# Patient Record
Sex: Female | Born: 1990 | Race: Asian | Hispanic: No | Marital: Married | Smoking: Never smoker
Health system: Southern US, Community
[De-identification: ages and names within clinical notes are randomized; demographics above are authoritative.]

## PROBLEM LIST (undated history)

## (undated) ENCOUNTER — Inpatient Hospital Stay (HOSPITAL_COMMUNITY): Payer: Self-pay

## (undated) DIAGNOSIS — B1910 Unspecified viral hepatitis B without hepatic coma: Secondary | ICD-10-CM

## (undated) DIAGNOSIS — R519 Headache, unspecified: Secondary | ICD-10-CM

## (undated) DIAGNOSIS — R51 Headache: Secondary | ICD-10-CM

## (undated) DIAGNOSIS — O98419 Viral hepatitis complicating pregnancy, unspecified trimester: Secondary | ICD-10-CM

## (undated) HISTORY — PX: NO PAST SURGERIES: SHX2092

---
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
## 2011-09-20 ENCOUNTER — Ambulatory Visit (INDEPENDENT_AMBULATORY_CARE_PROVIDER_SITE_OTHER): Payer: Self-pay | Admitting: Family Medicine

## 2011-09-20 VITALS — BP 106/67 | HR 75 | Temp 98.6°F | Resp 16 | Ht <= 58 in | Wt 99.4 lb

## 2011-09-20 DIAGNOSIS — L709 Acne, unspecified: Secondary | ICD-10-CM

## 2011-09-20 DIAGNOSIS — L708 Other acne: Secondary | ICD-10-CM

## 2011-09-20 MED ORDER — CLINDAMYCIN PHOSPHATE 1 % EX GEL: Freq: Two times a day (BID) | CUTANEOUS | Status: AC

## 2011-09-20 NOTE — Progress Notes (Signed)
  Subjective:    Patient ID: Michele Larson, female    DOB: May 10, 1991, 21 y.o.   MRN: [id number]  HPI 21 yo female with facial acne. Broke out 1 week ago.  No medicines.  Hasn't had problem before.  Washes face with Clean and clear.  No lotion or moisturizer.     Review of Systems Negative except as per HPI     Objective:   Physical Exam  Constitutional: She appears well-developed and well-nourished.  Pulmonary/Chest: Effort normal.  Face with scattered, red, pustular acne Around right eye, red, dry, flaky skin on eyelid and along lower lid        Assessment & Plan:  Acne - wash with cetaphil or aveeno.  Then clindamycin gel.  Then cetaphil or aveeno lotion.  BiD  Aquafor or eucerin ointment for dry areas around right eye.   Discussed won't get better immediately.  Can take weeks.

## 2013-06-13 ENCOUNTER — Ambulatory Visit: Payer: Self-pay | Admitting: Family Medicine

## 2013-06-13 VITALS — BP 102/70 | HR 90 | Temp 97.5°F | Resp 16 | Ht <= 58 in | Wt 90.0 lb

## 2013-06-13 DIAGNOSIS — G43109 Migraine with aura, not intractable, without status migrainosus: Secondary | ICD-10-CM

## 2013-06-13 DIAGNOSIS — L709 Acne, unspecified: Secondary | ICD-10-CM

## 2013-06-13 MED ORDER — PROMETHAZINE HCL 12.5 MG PO TABS: 12.5000 mg | ORAL_TABLET | Freq: Three times a day (TID) | ORAL | Status: DC | PRN

## 2013-06-13 MED ORDER — SUMATRIPTAN SUCCINATE 100 MG PO TABS: 100.0000 mg | ORAL_TABLET | ORAL | Status: DC | PRN

## 2013-06-13 MED ORDER — TOPIRAMATE 50 MG PO TABS: 50.0000 mg | ORAL_TABLET | Freq: Two times a day (BID) | ORAL | Status: DC

## 2013-06-13 NOTE — Patient Instructions (Signed)
Recurrent Migraine Headache  A migraine headache is an intense, throbbing pain on one or both sides of your head. Recurrent migraines keep coming back. A migraine can last for 30 minutes to several hours.  CAUSES   The exact cause of a migraine headache is not always known. However, a migraine may be caused when nerves in the brain become irritated and release chemicals that cause inflammation. This causes pain.   SYMPTOMS    Pain on one or both sides of your head.   Pulsating or throbbing pain.   Severe pain that prevents daily activities.   Pain that is aggravated by any physical activity.   Nausea, vomiting, or both.   Dizziness.   Pain with exposure to bright lights, loud noises, or activity.   General sensitivity to bright lights, loud noises, or smells.  Before you get a migraine, you may get warning signs that a migraine is coming (aura). An aura may include:   Seeing flashing lights.   Seeing bright spots, halos, or zig-zag lines.   Having tunnel vision or blurred vision.   Having feelings of numbness or tingling.   Having trouble talking.   Having muscle weakness.  MIGRAINE TRIGGERS  Examples of triggers of migraine headaches include:    Alcohol.   Smoking.   Stress.   Menstruation.   Aged cheeses.   Foods or drinks that contain nitrates, glutamate, aspartame, or tyramine.   Lack of sleep.   Chocolate.   Caffeine.   Hunger.   Physical exertion.   Fatigue.   Medicines used to treat chest pain (nitroglycerine), birth control pills, estrogen, and some blood pressure medicines.  DIAGNOSIS   A recurrent migraine headache is often diagnosed based on:   Symptoms.   Physical examination.   A CT scan or MRI of your head.  TREATMENT   Medicines may be given for pain and nausea. Medicines can also be given to help prevent recurrent migraines.  HOME CARE INSTRUCTIONS   Only take over-the-counter or prescription medicines for pain or discomfort as directed by your caregiver. The use of  long-term narcotics is not recommended.   Lie down in a dark, quiet room when you have a migraine.   Keep a journal to find out what may trigger your migraine headaches. For example, write down:   What you eat and drink.   How much sleep you get.   Any change to your diet or medicines.   Limit alcohol consumption.   Quit smoking if you smoke.   Get 7 to 9 hours of sleep, or as recommended by your caregiver.   Limit stress.   Keep lights dim if bright lights bother you and make your migraines worse.  SEEK MEDICAL CARE IF:    You do not get relief from the medicines given to you.   You have a recurrence of pain.  SEEK IMMEDIATE MEDICAL CARE IF:   Your migraine becomes severe.   You have a fever.   You have a stiff neck.   You have loss of vision.   You have muscular weakness or loss of muscle control.   You start losing your balance or have trouble walking.   You feel faint or pass out.   You have severe symptoms that are different from your first symptoms.  MAKE SURE YOU:    Understand these instructions.   Will watch your condition.   Will get help right away if you are not doing well or get worse.    Document Released: 04/13/2001 Document Revised: 10/11/2011 Document Reviewed: 07/09/2011  Exit[employer] Patient Information 2014 [employer].

## 2013-06-13 NOTE — Progress Notes (Signed)
@UMFCLOGO @  This chart was scribed for Michele Sidle, MD by Quintella Reichert, ED scribe.  This patient was seen in room [facility] Room 1 and the patient's care was started at 10:19 AM.  Patient ID: Michele Larson MRN: [id number], DOB: [birth year], 22 y.o. Date of Encounter: 06/13/2013, 10:19 AM  Primary Physician: No PCP Per Patient  Chief Complaint  Patient presents with   Emesis    off and on for a long time    Headache     HPI: 22 y.o. year old [place] female who works at a [employer] and is planning to return to school in January.  Pt speaks limited English and her cousin is translating for much of the history.  She presents with over a one-year history of episodes of headaches with associated vomiting.  Pt states that she has an aura which she describes as feeling fatigued and generally unwell, and she then develops a posterior headache and vomits shortly afterwards.  Symptoms do not occur every day.  They are not brought on by eating.  She also notes that after she vomits her chest hurts and her eyes become red.    Pt states that she was diagnosed with migraines in [place].  Cousin states that she also has h/o migraines.    No past medical history on file.   Home Meds: Prior to Admission medications   Not on File    Allergies: No Known Allergies  History   Social History   Marital Status: Single    Spouse Name: N/A    Number of Children: N/A   Years of Education: N/A   Occupational History   Not on file.   Social History Main Topics   Smoking status: Never Smoker    Smokeless tobacco: Not on file   Alcohol Use: Not on file   Drug Use: Not on file   Sexual Activity: Not on file   Other Topics Concern   Not on file   Social History Narrative   No narrative on file     Review of Systems: Constitutional: positive for fatigue and general malaise (preceding headaches). negative for chills, fever, night sweats, weight changes.  HEENT: negative for  vision changes, hearing loss, congestion, rhinorrhea, ST, epistaxis, or sinus pressure Cardiovascular: positive for chest pain (after vomiting). Respiratory: negative for hemoptysis, wheezing, shortness of breath, or cough Abdominal: negative for abdominal pain.  positive for nausea and vomiting.  negative for diarrhea or constipation. Dermatological: negative for rash Neurologic: negative for headache, dizziness, or syncope All other systems reviewed and are otherwise negative with the exception to those above and in the HPI.   Physical Exam: Blood pressure 102/70, pulse 90, temperature 97.5 F (36.4 C), temperature source Oral, resp. rate 16, height 4\' 8"  (1.422 m), weight 90 lb (40.824 kg), last menstrual period 05/21/2013, SpO2 100.00%., Body mass index is 20.19 kg/(m^2). General: Well developed, well nourished, in no acute distress. Head: Normocephalic, atraumatic, eyes without discharge, sclera non-icteric, nares are without discharge.  Fundi are normal, EOMI, PERRL. Bilateral auditory canals clear, TM's are without perforation, pearly grey and translucent with reflective cone of light bilaterally. Oral cavity moist, posterior pharynx without exudate, erythema, peritonsillar abscess, or post nasal drip.   Neck: Supple. No thyromegaly. Full ROM. No lymphadenopathy. Lungs: Clear bilaterally to auscultation without wheezes, rales, or rhonchi. Breathing is unlabored. Heart: RRR with S1 S2. No murmurs, rubs, or gallops appreciated. Abdomen: Soft, non-tender, non-distended with normoactive bowel sounds. No hepatomegaly. No rebound/guarding. No  obvious abdominal masses. Msk:  Strength and tone normal for age. Extremities/Skin: Warm and dry. No clubbing or cyanosis. No edema. No rashes or suspicious lesions.  Marked cystic acne on face Neuro: Alert and oriented X 3. Moves all extremities spontaneously. Gait is normal. CNII-XII grossly in tact. Psych:  Responds to questions appropriately with a  normal affect.     ASSESSMENT AND PLAN:  22 y.o. year old female with migraines with migraines with aura. Migraine headache with aura - Plan: SUMAtriptan (IMITREX) 100 MG tablet, topiramate (TOPAMAX) 50 MG tablet, promethazine (PHENERGAN) 12.5 MG tablet  Benzoyl peroxide preparation to face qhs  Signed, Michele Sidle, MD 06/13/2013 10:19 AM

## 2014-08-02 NOTE — L&D Delivery Note (Signed)
  24 y.o. G1P0 female at [redacted]w[redacted]d who presented after SROM.  Labor augmented with pitocin. Second stage was characterized by recurrent deep variable decelerations and intrauterine intraamniotic infection treated with antibiotics. After pushing for about 3 hours, patient was counseled regarding vacuum assisted delivery vs cesarean delivery.  Risks reviewed in detail, all questions answered.  Patient and her husband opted for vacuum assisted operative vaginal delivery.  Risks of vacuum assistance were discussed in detail, including but not limited to, bleeding, infection, damage to maternal tissues, fetal cephalohematoma, inability to effect vaginal delivery of the head or shoulder dystocia that cannot be resolved by established maneuvers and need for emergency cesarean section. Patient gave verbal consent.   Operative Delivery Note At 11:14 PM a viable female was delivered via Vaginal, Vacuum Investment banker, operational).  Presentation: vertex; Position: Right,, Occiput,, Transverse; Station: +3.  Delivery complicated by shoulder dystocia that lasted for 1 minute and 50 seconds; resolved with the following maneuvers: Lysle Dingwall, suprapubic pressure, corkscrew.  Infant handed over to awaiting NICU team.  APGAR: 2, 6, 8; weight 7 lb 2.8 oz (3255 g).   Placenta status: Intact, Spontaneous.   Cord: 3 vessels with the following complications: None.  Arterial cord pH: 7.22  Anesthesia: Epidural  Episiotomy: Median Lacerations: Perineal;3rd degree Suture Repair: 0 and 3.0 vicryl Est. Blood Loss (mL):  300 ml  Mom to postpartum.  Baby to NICU.  Tereso Newcomer, MD 01/19/2015, 11:50 PM

## 2014-08-26 ENCOUNTER — Other Ambulatory Visit (HOSPITAL_COMMUNITY): Payer: Self-pay | Admitting: Urology

## 2014-08-26 DIAGNOSIS — Z3689 Encounter for other specified antenatal screening: Secondary | ICD-10-CM

## 2014-08-26 LAB — OB RESULTS CONSOLE RPR: RPR: NONREACTIVE

## 2014-08-26 LAB — OB RESULTS CONSOLE RUBELLA ANTIBODY, IGM: RUBELLA: IMMUNE

## 2014-08-26 LAB — OB RESULTS CONSOLE ABO/RH: RH Type: POSITIVE

## 2014-08-26 LAB — OB RESULTS CONSOLE ANTIBODY SCREEN: Antibody Screen: NEGATIVE

## 2014-08-26 LAB — OB RESULTS CONSOLE HIV ANTIBODY (ROUTINE TESTING): HIV: NONREACTIVE

## 2014-08-26 LAB — OB RESULTS CONSOLE HEPATITIS B SURFACE ANTIGEN: Hepatitis B Surface Ag: POSITIVE

## 2014-08-30 ENCOUNTER — Encounter (HOSPITAL_COMMUNITY): Payer: Self-pay

## 2014-08-30 ENCOUNTER — Ambulatory Visit (HOSPITAL_COMMUNITY)
Admission: RE | Admit: 2014-08-30 | Discharge: 2014-08-30 | Disposition: A | Discharge status: Home / Self Care | Payer: Medicaid Other | Source: Ambulatory Visit | Attending: Urology | Admitting: Urology

## 2014-08-30 DIAGNOSIS — Z3A19 19 weeks gestation of pregnancy: Secondary | ICD-10-CM | POA: Diagnosis not present

## 2014-08-30 DIAGNOSIS — Z36 Encounter for antenatal screening of mother: Secondary | ICD-10-CM | POA: Diagnosis not present

## 2014-08-30 DIAGNOSIS — Z3689 Encounter for other specified antenatal screening: Secondary | ICD-10-CM | POA: Insufficient documentation

## 2014-09-03 ENCOUNTER — Other Ambulatory Visit (HOSPITAL_COMMUNITY): Payer: Self-pay | Admitting: Nurse Practitioner

## 2014-09-03 DIAGNOSIS — IMO0002 Reserved for concepts with insufficient information to code with codable children: Secondary | ICD-10-CM

## 2014-09-03 DIAGNOSIS — Z0489 Encounter for examination and observation for other specified reasons: Secondary | ICD-10-CM

## 2014-09-03 DIAGNOSIS — Z0374 Encounter for suspected problem with fetal growth ruled out: Secondary | ICD-10-CM

## 2014-09-06 ENCOUNTER — Encounter (HOSPITAL_COMMUNITY): Payer: Self-pay | Admitting: *Deleted

## 2014-09-06 ENCOUNTER — Inpatient Hospital Stay (HOSPITAL_COMMUNITY)
Admission: AD | Admit: 2014-09-06 | Discharge: 2014-09-06 | Disposition: A | Discharge status: Home / Self Care | Payer: Medicaid Other | Source: Ambulatory Visit | Attending: Obstetrics & Gynecology | Admitting: Obstetrics & Gynecology

## 2014-09-06 DIAGNOSIS — Z3A22 22 weeks gestation of pregnancy: Secondary | ICD-10-CM | POA: Insufficient documentation

## 2014-09-06 DIAGNOSIS — O9989 Other specified diseases and conditions complicating pregnancy, childbirth and the puerperium: Secondary | ICD-10-CM | POA: Insufficient documentation

## 2014-09-06 DIAGNOSIS — J029 Acute pharyngitis, unspecified: Secondary | ICD-10-CM

## 2014-09-06 DIAGNOSIS — J069 Acute upper respiratory infection, unspecified: Secondary | ICD-10-CM | POA: Diagnosis not present

## 2014-09-06 LAB — RAPID STREP SCREEN (MED CTR MEBANE ONLY): Streptococcus, Group A Screen (Direct): NEGATIVE

## 2014-09-06 MED ORDER — GUAIFENESIN ER 600 MG PO TB12: 600.0000 mg | ORAL_TABLET | Freq: Two times a day (BID) | ORAL | Status: DC

## 2014-09-06 MED ORDER — PHENOL 1.4 % MT LIQD: 1.0000 | OROMUCOSAL | Status: DC | PRN

## 2014-09-06 NOTE — MAU Provider Note (Signed)
History     CSN: [id number]  Arrival date and time: 09/06/14 1527   None     Chief Complaint  Patient presents with  . Sore Throat  . Cough   HPI This is a 24 y.o. female at 5532w1d who presents with c/o sore throat and sneezing. Denies ear pain. Denies chills but reports being hot, though did not take temperature. [facility] Denies abdominal pain or bleeding.   RN Note:  Expand All Collapse All   Pt states she has a sore throat & a cough, was hot this morning but did not take her temp @ home. Denies abd pain, bleeding or discharge. [facility]          OB History    Gravida Para Term Preterm AB TAB SAB Ectopic Multiple Living   1               Past Medical History  Diagnosis Date  . Medical history non-contributory     Past Surgical History  Procedure Laterality Date  . No past surgeries      No family history on file.  History  Substance Use Topics  . Smoking status: Never Smoker   . Smokeless tobacco: Not on file  . Alcohol Use: No    Allergies: No Known Allergies  Prescriptions prior to admission  Medication Sig Dispense Refill Last Dose  . promethazine (PHENERGAN) 12.5 MG tablet Take 1 tablet (12.5 mg total) by mouth every 8 (eight) hours as needed for nausea or vomiting. 20 tablet 5   . SUMAtriptan (IMITREX) 100 MG tablet Take 1 tablet (100 mg total) by mouth every 2 (two) hours as needed for migraine or headache. May repeat in 2 hours if headache persists or recurs. 5 tablet 6   . topiramate (TOPAMAX) 50 MG tablet Take 1 tablet (50 mg total) by mouth 2 (two) times daily. 30 tablet 5     Review of Systems  Constitutional: Negative for fever, chills and malaise/fatigue.  HENT: Positive for congestion and sore throat.   Eyes: Negative for blurred vision.  Gastrointestinal: Negative for nausea, vomiting and abdominal pain.  Neurological: Negative for dizziness, weakness and headaches.   Physical Exam   Blood pressure  110/62, pulse 92, temperature 98.3 F (36.8 C), temperature source Oral, resp. rate 18.  Physical Exam  Constitutional: She is oriented to person, place, and time. She appears well-developed and well-nourished. No distress.  HENT:  Head: Normocephalic and atraumatic.  Right Ear: External ear normal.  Left Ear: External ear normal.  Mouth/Throat: Oropharynx is clear and moist. No oropharyngeal exudate (posterior pharynx erethematous, but no pus).  Cardiovascular: Normal rate, regular rhythm and normal heart sounds.  Exam reveals no gallop and no friction rub.   No murmur heard. Respiratory: Effort normal and breath sounds normal. No respiratory distress. She has no wheezes. She has no rales. She exhibits no tenderness.  GI: Soft. She exhibits no distension and no mass. There is no tenderness. There is no rebound and no guarding.  Musculoskeletal: Normal range of motion.  Neurological: She is alert and oriented to person, place, and time.  Skin: Skin is warm and dry.  Psychiatric: She has a normal mood and affect.    MAU Course  Procedures  MDM Results for orders placed or performed during the hospital encounter of 09/06/14 (from the past 24 hour(s))  Rapid strep screen     Status: None   Collection Time: 09/06/14  4:08 PM  Result Value Ref Range   Streptococcus, Group A Screen (Direct) NEGATIVE NEGATIVE    Assessment and Plan  A:  SIUP at [redacted]w[redacted]d        Upper Respiratory Infection, viral       Sore throat, viral  P:  Discussed supportive care       Declined interpretor       Mucinex and chloraseptic spray prn       Come back if develops fever        Follow up in office.  Wynelle Bourgeois 09/06/2014, 3:54 PM

## 2014-09-06 NOTE — Discharge Instructions (Signed)
Nhi?m Trng ???ng H H?p Trn, Ng??i L?n (Upper Respiratory Infection, Adult) Nhi?m trng ???ng h h?p trn (URI) ?i khi cn ???c g?i l c?m l?nh thng th??ng. ???ng h h?p trn bao g?m m?i, xoang, h?ng, kh qu?n v ph? qu?n. Ph? qu?n l ???ng h h?p d?n ??n ph?i. H?u h?t m?i ng??i c?i thi?n trong vng 1 tu?n, nh?ng cc tri?u ch?ng c th? ko di ??n 2 tu?n. Ho cn l?i c th? ko di th?m ch lu h?n.  NGUYN NHN Nhi?u vi rt khc nhau c th? ly nhi?m cc m lt ???ng h h?p trn. Cc m ny tr? nn b? kch thch, vim v th??ng tr? nn r?t ?m ??t. S? s?n sinh ch?t nh?n c?ng l ph? bi?n. C?m l?nh d? ly. B?n c th? d? dng ly vi rt cho ng??i khc khi ti?p xc b?ng mi?ng. ?i?u ny bao g?m hn, dng chung ly, ho ho?c h?t h?i. Ch?m vo mi?ng ho?c m?i b?n v sau ? ch?m vo m?t b? m?t, sau ? ng??i khc ch?m vo b? m?t ny c?ng c th? b? ly vi rt. TRI?U CH?NG Cc tri?u ch?ng th??ng pht tri?n 1-3 ngy sau khi b?n ti?p xc v?i vi rt c?m l?nh. Tri?u ch?ng ? m?i ng??i m?t khc. Cc tri?u ch?ng c th? bao g?m:  Ch?y n??c m?i.  H?t h?i.  Ngh?t m?i.  Kch thch xoang.  ?au h?ng.  M?t ti?ng (vim thanh qu?n).  Ho.  M?t m?i.  ?au nh?c c?.  ?n khng ngon mi?ng.  ?au ??u.  S?t nh?. CH?N ?ON B?n c th? ch?n ?on b?nh c?m l?nh c?a chnh mnh d?a vo nh?ng tri?u ch?ng quen thu?c, v h?u h?t m?i ng??i b? c?m l?nh 2-3 l?n m?i n?m. Chuyn gia ch?m [place] s?c kh?e c?a b?n c th? xc nh?n ?i?u ny sau khi khm b?n. Quan tr?ng nh?t, chuyn gia ch?m [place] y t? c?a b?n c th? ki?m tra v ??m b?o r?ng cc tri?u ch?ng c?a b?n khng ph?i do m?t b?nh khc nh? vim h?ng, vim xoang, vim ph?i, hen suy?n ho?c vim ti?u thi?t. Khng c?n thi?t xt nghi?m mu, ki?m tra h?ng v ch?p [place] ?? ch?n ?on c?m l?nh thng th??ng, nh?ng ?i khi chng c th? h?u ch trong vi?c lo?i tr? cc b?nh khc nghim tr?ng h?n. Chuyn gia ch?m [place] s?c kh?e c?a b?n s? quy?t ??nh c c?n thm cc xt nghi?m khc khng. R?I RO V  BI?N CH?NG B?n c th? c nguy c? b? m?t tr??ng h?p nghim tr?ng h?n c?m l?nh thng th??ng n?u b?n ht thu?c l, c b?nh tim m?n tnh (nh? suy tim), ho?c b?nh ph?i (nh? hen suy?n), ho?c n?u b?n c h? th?ng mi?n d?ch suy y?u. Nh?ng ng??i r?t tr? v r?t gi c?ng c nguy c? nhi?m trng nghim tr?ng h?n. Vim xoang do vi khu?n, nhi?m trng tai gi?a, vim ph?i do vi khu?n c th? khi?n c?m l?nh thng th??ng ph?c t?p thm. C?m l?nh thng th??ng c th? lm tr?m tr?ng thm b?nh hen suy?n v t?c ngh?n ph?i m?n tnh (COPD). ?i khi, nh?ng bi?n ch?ng ny c th? yu c?u ch?m [place] y t? kh?n c?p v c th? ?e d?a tnh m?ng. PHNG NG?A Cch t?t nh?t ?? b?o v? trnh c?m l?nh l th?c hnh v? sinh t?t. Hessie Diener ti?p xc b?ng mi?ng ho?c b?ng tay v?i nh?ng ng??i c tri?u ch?ng c?m l?nh. R?a tay th??ng xuyn n?u ti?p xc x?y ra. Khng c b?ng ch?ng r rng r?ng  vitamin C, vitamin E, Echinacea ho?c t?p th? d?c lm gi?m kh? n?ng pht tri?n c?m l?nh. Tuy nhin, b?n lun nn ngh? ng?i nhi?u v c ch? ?? dinh d??ng t?t. ?I?U TR? ?i?u tr? nh?m gi?m tri?u ch?ng. Khng c thu?c ch?a. Khng sinh khng hi?u qu?, v nhi?m trng do vi rt gy ra, ch? khng ph?i do vi khu?n. ?i?u tr? c th? bao g?m:  U?ng nhi?u n??c h?n. ?? u?ng th? thao cung c?p ch?t ?i?n gi?i, ???ng v cc ch?t l?ng c gi tr?Marland Kitchen  Th? s??ng m n??c nng ho?c h?i n??c (bnh phun h?i ho?c vi sen).  ?n sp g ho?c n??c canh trong khc v duy tr ch? ?? dinh d??ng t?t.  Ngh? ng?i th?t nhi?u.  S? d?ng thu?c [place] mi?ng ho?c thu?c u?ng ?? t?o c?m gic tho?i mi.  Ki?m sot s?t b?ng ibuprofen ho?c acetaminophen theo ch? d?n c?a chuyn gia ch?m [place] s?c kh?e.  T?ng c??ng s? d?ng thu?c d?ng ht n?u b?n b? b?nh hen suy?n. S? d?ng gel k?m v k?o dn k?m trong 24 gi? ??u tin sau khi b? c?m l?nh thng th??ng c th? rt ng?n th?i gian v lm gi?m b?t m?c ?? nghim tr?ng c?a cc tri?u ch?ng. Thu?c gi?m ?au c th? c tc d?ng v?i s?t, ?au nh?c c? b?p v ?au c? h?ng. Nhi?u lo?i thu?c  khng c?n k ??n c s?n ?? ?i?u tr? t?c ngh?n v ch?y n??c m?i. Chuyn gia ch?m [place] s?c kh?e c?a b?n c th? ??a ra cc khuy?n co v c th? ?? ngh? s? d?ng thu?c x?t m?i ho?c ph?i cho cc tri?u ch?ng khc. H??NG D?N CH?M [place] T?I NH  Ch? s? d?ng thu?c khng c?n k toa ho?c thu?c c?n k toa ?? gi?m ?au, gi?m c?m gic kh ch?u ho?c h? s?t theo ch? d?n c?a chuyn gia ch?m [place] s?c kh?e c?a b?n.  S? d?ng my t?o ?? ?m s??ng m ?m ho?c ht h?i n??c t? m?t vi sen ?? t?ng ?? ?m khng kh. ?i?u ny c th? ti?t ?m v gip d? th? h?n.  U?ng ?? n??c v dung d?ch ?? n??c ti?u trong ho?c c mu vng nh?t.  Ngh? ng?i theo nhu c?u.  Tr? l?i lm vi?c khi nhi?t ?? c?a b?n ? tr? l?i bnh th??ng ho?c chuyn gia ch?m [place] y t? [place] b?n lm nh? v?y. B?n c th? c?n ? nh lu h?n ?? trnh ly nhi?m cho ng??i khc. B?n c?ng c th? s? d?ng m?t n? v r?a tay c?n th?n ?? ng?n ng?a ly lan vi rt. HY ?I KHM N?U:  Sau vi ngy ??u tin, b?n c?m th?y t? h?n ch? khng ph?i l t?t h?n.  B?n c?n l?i khuyn c?a chuyn gia ch?m [place] s?c kh?e v? thu?c ?? ki?m sot cc tri?u ch?ng.  B?n b? ?n l?nh, kh th? tr?m tr?ng h?n ho?c ??m mu nu ho?c ??. ?y c th? l nh?ng d?u hi?u c?a vim ph?i.  B?n b? ch?y n??c m?i mu vng ho?c nu ho?c ?au ? m?t, ??c bi?t l khi b?n u?n cong v? pha tr??c. ?y c th? l nh?ng d?u hi?u c?a vim xoang.  B?n b? s?t, s?ng h?ch c?, ?au khi nu?t ho?c cc vng mu tr?ng ? m?t sau c?a c? h?ng. ?y c th? l nh?ng d?u hi?u c?a vim h?ng. HY NGAY L?P T?C ?I KHM N?U:  B?n b? s?t.  B?n b? nh?c ??u d? d?i ho?c dai d?ng, ?au tai, ?au  xoang ho?c ?au ng?c.  B?n b? th? kh kh, ho ko di, ho ra mu ho?c c s? thay ??i trong ch?t nh?n thng th??ng c?a b?n (n?u b?n b? b?nh ph?i m?n tnh).  B?n b? ?au c? ho?c c? c?ng. Document Released: 02/01/2011 Document Revised: 03/21/2013 [facility] Patient Information 2015 [place]. This information is not intended to replace advice given to you by your  health care provider. Make sure you discuss any questions you have with your health care provider.  Vim h?ng (Pharyngitis) Vim h?ng l tnh tr?ng t?y ??, ?au, v s?ng (vim) ho?ng c?a qu v?.  NGUYN NHN.  Vim h?ng th??ng do nhi?m trng gy ra. Nh?ng nhi?m trng ny ch? y?u do vi rt gy ra (do vi rt) v l m?t ph?n c?a b?nh c?m l?nh. Tuy nhin, ?i khi vim h?ng do vi khu?n gy ra (do vi khu?n). Vim h?ng c?ng c th? do d? ?ng gy ra. Vim h?ng do vi rt c th? ly lan t? ng??i sang ng??i thng qua ho, h?t h?i, v nh?ng v?t d?ng ho?c ?? dng c nhn (c?c, d?a, tha, bn ch?i ?nh r?ng). Vim h?ng do vi khu?n c th? ly lan t? ng??i sang ng??i thng qua ti?p xc g?n g?i h?n, ch?ng h?n nh? hn nhau.  D?U HI?U V TRI?U CH?NG  Cc tri?u ch?ng c?a vim h?ng bao g?m:   ?au h?ng.  M?t m?i (m?t)  S?t nh?.  ?au ??u.  ?au kh?p ho?c ?au c?.  Pht ban Database administrator.  S?ng h?ch b?ch huy?t.  M?t l?p mng trng gi?ng m?ng bm trong h?ng ho?c ami?an (th??ng nhn th?y ? b?nh vim h?ng do vi khu?n). CH?N ?ON  Chuyn gia ch?m [place] s?c kh?e s? h?i qu v? v? b?nh v cc tri?u ch?ng c?a qu v?. Th??ng ch? c?n b?nh s? c?a qu v?, cng v?i khm th?c th? ?? c th? ch?n ?on vim h?ng. ?i khi c?n lm xt nghi?m nhanh tm lin c?u khu?n. C th? c?n lm cc xt nghi?m khc, ty thu?c vo nguyn nhn b? nghi ng?.  ?I?U TR?  Vim h?ng do vi rt th??ng s? ?? trong 3 -4 ngy m khng c?n dng thu?c. Vim h?ng do vi khu?n c th? ???c ?i?u tr? b?ng cc lo?i thu?c di?t vi trng (khng sinh).  H??NG D?N CH?M [place] T?I NH   U?ng ?? n??c v ch?t l?ng ?? n??c ti?u trong ho?c c mu vng nh?t.  Ch? s? d?ng thu?c khng c?n k ??n ho?c thu?c c?n k ??n theo ch? d?n c?a chuyn gia ch?m [place] s?c kh?e:  N?u qu v? ???c k dng thu?c khng sinh, hy b?o ??m vi?c qu v? dng h?t thu?c ngay c? khi qu v? b?t ??u c?m th?y ?? h?n.  Khngdng aspirin.  Ngh? ng?i nhi?u.  Xc mi?ng b?ng 8 ao-x? n??c mu?i ( tha c ph mu?i pha  vo 1 lt ([place]h) n??c) 1 - 2 ti?ng m?t l?n ?? lm d?u h?ng.  Vim ng?m vim h?ng (n?u qu v? khng c nguy c? b? ngh?t th?) ho?c thu?c x?t c th? ???c s? d?ng ?? lm d?u h?ng. ?I KHM N?U:   Qu v? c c?c s?ng to v c c?m gic ?au ? c? qu v?.  Qu v? b? pht ban.  Qu v? ho ra ??m mu xanh l cy, nu vng, ho?c l?n mu. NGAY L?P T?C ?I KHM N?U:   C? qu v? b? c?ng.  Qu v? b? ch?y n??c di  ho?c khng th? nu?t ch?t l?ng.  Qu v? nn m?a ho?c khng th? gi? thu?c ho?c ch?t l?ng trong c? th?.  Qu v? b? ?au nhi?u m khng kh?i sau khi s? d?ng thu?c theo khuy?n ngh?Sander Nephew v? b? kh th? (khng ph?i l do ng?t m?i). ??M B?O QU V?:   Hi?u r cc h??ng d?n ny.  S? theo di tnh tr?ng c?a mnh.  S? yu c?u tr? gip ngay l?p t?c n?u qu v? c?m th?y khng kh?e ho?c th?y tr?m tr?ng h?n. Document Released: 07/19/2005 Document Revised: 05/09/2013 [facility] Patient Information 2015 [place], [place]. This information is not intended to replace advice given to you by your health care provider. Make sure you discuss any questions you have with your health care provider.

## 2014-09-06 NOTE — MAU Note (Signed)
Pt states she has a sore throat & a cough, was hot this morning but did not take her temp @ home.  Denies abd pain, bleeding or discharge.  [facility]

## 2014-09-08 LAB — CULTURE, GROUP A STREP

## 2014-09-27 ENCOUNTER — Ambulatory Visit (HOSPITAL_COMMUNITY): Payer: Self-pay

## 2014-10-01 ENCOUNTER — Ambulatory Visit (HOSPITAL_COMMUNITY)
Admission: RE | Admit: 2014-10-01 | Discharge: 2014-10-01 | Disposition: A | Discharge status: Home / Self Care | Payer: Medicaid Other | Source: Ambulatory Visit | Attending: Nurse Practitioner | Admitting: Nurse Practitioner

## 2014-10-01 DIAGNOSIS — Z0374 Encounter for suspected problem with fetal growth ruled out: Secondary | ICD-10-CM | POA: Diagnosis not present

## 2014-10-01 DIAGNOSIS — Z0489 Encounter for examination and observation for other specified reasons: Secondary | ICD-10-CM

## 2014-10-01 DIAGNOSIS — IMO0002 Reserved for concepts with insufficient information to code with codable children: Secondary | ICD-10-CM | POA: Insufficient documentation

## 2014-10-01 DIAGNOSIS — Z3A25 25 weeks gestation of pregnancy: Secondary | ICD-10-CM | POA: Insufficient documentation

## 2014-10-01 DIAGNOSIS — Z36 Encounter for antenatal screening of mother: Secondary | ICD-10-CM | POA: Diagnosis present

## 2014-12-16 LAB — OB RESULTS CONSOLE GBS: STREP GROUP B AG: NEGATIVE

## 2014-12-16 LAB — OB RESULTS CONSOLE GC/CHLAMYDIA
Chlamydia: NEGATIVE
GC PROBE AMP, GENITAL: NEGATIVE

## 2015-01-10 ENCOUNTER — Other Ambulatory Visit (HOSPITAL_COMMUNITY): Payer: Self-pay | Admitting: Physician Assistant

## 2015-01-10 DIAGNOSIS — O480 Post-term pregnancy: Secondary | ICD-10-CM

## 2015-01-14 ENCOUNTER — Ambulatory Visit (HOSPITAL_COMMUNITY)
Admission: RE | Admit: 2015-01-14 | Discharge: 2015-01-14 | Disposition: A | Discharge status: Home / Self Care | Payer: Medicaid Other | Source: Ambulatory Visit | Attending: Physician Assistant | Admitting: Physician Assistant

## 2015-01-14 ENCOUNTER — Encounter (HOSPITAL_COMMUNITY): Payer: Self-pay

## 2015-01-14 ENCOUNTER — Ambulatory Visit (HOSPITAL_COMMUNITY)
Admission: RE | Admit: 2015-01-14 | Discharge: 2015-01-14 | Disposition: A | Discharge status: Home / Self Care | Payer: Medicaid Other | Source: Ambulatory Visit | Attending: Nurse Practitioner | Admitting: Nurse Practitioner

## 2015-01-14 DIAGNOSIS — O480 Post-term pregnancy: Secondary | ICD-10-CM

## 2015-01-14 DIAGNOSIS — Z3A40 40 weeks gestation of pregnancy: Secondary | ICD-10-CM | POA: Insufficient documentation

## 2015-01-14 HISTORY — DX: Headache: R51

## 2015-01-14 HISTORY — DX: Headache, unspecified: R51.9

## 2015-01-16 ENCOUNTER — Inpatient Hospital Stay (HOSPITAL_COMMUNITY): Admission: RE | Admit: 2015-01-16 | Payer: Medicaid Other | Source: Ambulatory Visit

## 2015-01-17 ENCOUNTER — Inpatient Hospital Stay (HOSPITAL_COMMUNITY)
Admission: AD | Admit: 2015-01-17 | Discharge: 2015-01-17 | Disposition: A | Discharge status: Home / Self Care | Payer: Medicaid Other | Source: Ambulatory Visit | Attending: Obstetrics & Gynecology | Admitting: Obstetrics & Gynecology

## 2015-01-17 ENCOUNTER — Encounter (HOSPITAL_COMMUNITY): Payer: Self-pay | Admitting: *Deleted

## 2015-01-17 DIAGNOSIS — Z3493 Encounter for supervision of normal pregnancy, unspecified, third trimester: Secondary | ICD-10-CM | POA: Insufficient documentation

## 2015-01-17 DIAGNOSIS — Z3A40 40 weeks gestation of pregnancy: Secondary | ICD-10-CM | POA: Insufficient documentation

## 2015-01-17 NOTE — MAU Note (Signed)
Urine in lab 

## 2015-01-19 ENCOUNTER — Inpatient Hospital Stay (HOSPITAL_COMMUNITY): Payer: Medicaid Other | Admitting: Anesthesiology

## 2015-01-19 ENCOUNTER — Encounter (HOSPITAL_COMMUNITY): Payer: Self-pay | Admitting: *Deleted

## 2015-01-19 ENCOUNTER — Inpatient Hospital Stay (HOSPITAL_COMMUNITY)
Admission: AD | Admit: 2015-01-19 | Discharge: 2015-01-21 | DRG: 988 | Disposition: A | Discharge status: Home / Self Care | Payer: Medicaid Other | Source: Ambulatory Visit | Attending: Obstetrics & Gynecology | Admitting: Obstetrics & Gynecology

## 2015-01-19 DIAGNOSIS — O4290 Premature rupture of membranes, unspecified as to length of time between rupture and onset of labor, unspecified weeks of gestation: Secondary | ICD-10-CM

## 2015-01-19 DIAGNOSIS — IMO0001 Reserved for inherently not codable concepts without codable children: Secondary | ICD-10-CM

## 2015-01-19 DIAGNOSIS — Z3A40 40 weeks gestation of pregnancy: Secondary | ICD-10-CM | POA: Diagnosis present

## 2015-01-19 DIAGNOSIS — Z8759 Personal history of other complications of pregnancy, childbirth and the puerperium: Secondary | ICD-10-CM

## 2015-01-19 HISTORY — DX: Unspecified viral hepatitis B without hepatic coma: O98.419

## 2015-01-19 HISTORY — DX: Unspecified viral hepatitis B without hepatic coma: B19.10

## 2015-01-19 LAB — CBC
HCT: 38.9 % (ref 36.0–46.0)
HEMOGLOBIN: 13 g/dL (ref 12.0–15.0)
MCH: 25 pg — ABNORMAL LOW (ref 26.0–34.0)
MCHC: 33.4 g/dL (ref 30.0–36.0)
MCV: 74.8 fL — ABNORMAL LOW (ref 78.0–100.0)
PLATELETS: 222 10*3/uL (ref 150–400)
RBC: 5.2 MIL/uL — AB (ref 3.87–5.11)
RDW: 14.2 % (ref 11.5–15.5)
WBC: 14.4 10*3/uL — AB (ref 4.0–10.5)

## 2015-01-19 LAB — RPR: RPR Ser Ql: NONREACTIVE

## 2015-01-19 LAB — TYPE AND SCREEN
ABO/RH(D): B POS
Antibody Screen: NEGATIVE

## 2015-01-19 LAB — POCT FERN TEST: POCT Fern Test: POSITIVE

## 2015-01-19 LAB — HIV ANTIBODY (ROUTINE TESTING W REFLEX): HIV Screen 4th Generation wRfx: NONREACTIVE

## 2015-01-19 LAB — ABO/RH: ABO/RH(D): B POS

## 2015-01-19 MED ORDER — GENTAMICIN IN SALINE 1-0.9 MG/ML-% IV SOLN
100.0000 mg | Freq: Three times a day (TID) | INTRAVENOUS | Status: DC
  Filled 2015-01-19 (×2): qty 100

## 2015-01-19 MED ORDER — ACETAMINOPHEN 500 MG PO TABS
1000.0000 mg | ORAL_TABLET | Freq: Four times a day (QID) | ORAL | Status: DC | PRN
  Administered 2015-01-19: 1000 mg via ORAL
  Filled 2015-01-19: qty 2

## 2015-01-19 MED ORDER — CITRIC ACID-SODIUM CITRATE 334-500 MG/5ML PO SOLN
30.0000 mL | ORAL | Status: DC | PRN
  Filled 2015-01-19: qty 15

## 2015-01-19 MED ORDER — ONDANSETRON HCL 4 MG/2ML IJ SOLN: 4.0000 mg | Freq: Four times a day (QID) | INTRAMUSCULAR | Status: DC | PRN

## 2015-01-19 MED ORDER — FENTANYL 2.5 MCG/ML BUPIVACAINE 1/10 % EPIDURAL INFUSION (WH - ANES)
14.0000 mL/h | INTRAMUSCULAR | Status: DC | PRN
  Administered 2015-01-19 (×4): 14 mL/h via EPIDURAL
  Filled 2015-01-19 (×3): qty 125

## 2015-01-19 MED ORDER — FLEET ENEMA 7-19 GM/118ML RE ENEM: 1.0000 | ENEMA | RECTAL | Status: DC | PRN

## 2015-01-19 MED ORDER — OXYCODONE-ACETAMINOPHEN 5-325 MG PO TABS: 1.0000 | ORAL_TABLET | ORAL | Status: DC | PRN

## 2015-01-19 MED ORDER — ACETAMINOPHEN 325 MG PO TABS: 650.0000 mg | ORAL_TABLET | ORAL | Status: DC | PRN

## 2015-01-19 MED ORDER — OXYTOCIN 40 UNITS IN LACTATED RINGERS INFUSION - SIMPLE MED
1.0000 m[IU]/min | INTRAVENOUS | Status: DC
  Administered 2015-01-19: 2 m[IU]/min via INTRAVENOUS
  Filled 2015-01-19: qty 1000

## 2015-01-19 MED ORDER — GENTAMICIN SULFATE 40 MG/ML IJ SOLN
120.0000 mg | Freq: Once | INTRAVENOUS | Status: AC
  Administered 2015-01-19: 120 mg via INTRAVENOUS
  Filled 2015-01-19: qty 3

## 2015-01-19 MED ORDER — TERBUTALINE SULFATE 1 MG/ML IJ SOLN: 0.2500 mg | Freq: Once | INTRAMUSCULAR | Status: AC | PRN

## 2015-01-19 MED ORDER — LACTATED RINGERS IV SOLN
500.0000 mL | INTRAVENOUS | Status: DC | PRN
  Administered 2015-01-19 (×3): 500 mL via INTRAVENOUS

## 2015-01-19 MED ORDER — LACTATED RINGERS IV SOLN
INTRAVENOUS | Status: DC
Start: 2015-01-19 — End: 2015-01-20
  Administered 2015-01-19 (×3): via INTRAVENOUS

## 2015-01-19 MED ORDER — HYDROXYZINE HCL 50 MG PO TABS
50.0000 mg | ORAL_TABLET | Freq: Four times a day (QID) | ORAL | Status: DC | PRN
  Filled 2015-01-19: qty 1

## 2015-01-19 MED ORDER — LIDOCAINE HCL (PF) 1 % IJ SOLN
INTRAMUSCULAR | Status: DC | PRN
  Administered 2015-01-19: 5 mL
  Administered 2015-01-19 (×2): 3 mL

## 2015-01-19 MED ORDER — LIDOCAINE HCL (PF) 1 % IJ SOLN
30.0000 mL | INTRAMUSCULAR | Status: DC | PRN
  Administered 2015-01-19: 30 mL via SUBCUTANEOUS
  Filled 2015-01-19: qty 30

## 2015-01-19 MED ORDER — FENTANYL CITRATE (PF) 100 MCG/2ML IJ SOLN: 100.0000 ug | INTRAMUSCULAR | Status: DC | PRN

## 2015-01-19 MED ORDER — EPHEDRINE 5 MG/ML INJ
10.0000 mg | INTRAVENOUS | Status: DC | PRN
Start: 2015-01-19 — End: 2015-01-20
  Filled 2015-01-19: qty 2

## 2015-01-19 MED ORDER — OXYCODONE-ACETAMINOPHEN 5-325 MG PO TABS: 2.0000 | ORAL_TABLET | ORAL | Status: DC | PRN

## 2015-01-19 MED ORDER — OXYTOCIN BOLUS FROM INFUSION
500.0000 mL | INTRAVENOUS | Status: DC
  Administered 2015-01-19: 500 mL via INTRAVENOUS

## 2015-01-19 MED ORDER — PHENYLEPHRINE 40 MCG/ML (10ML) SYRINGE FOR IV PUSH (FOR BLOOD PRESSURE SUPPORT)
80.0000 ug | PREFILLED_SYRINGE | INTRAVENOUS | Status: DC | PRN
Start: 2015-01-19 — End: 2015-01-20
  Filled 2015-01-19: qty 2
  Filled 2015-01-19: qty 20

## 2015-01-19 MED ORDER — DIPHENHYDRAMINE HCL 50 MG/ML IJ SOLN: 12.5000 mg | INTRAMUSCULAR | Status: DC | PRN

## 2015-01-19 MED ORDER — OXYTOCIN 40 UNITS IN LACTATED RINGERS INFUSION - SIMPLE MED: 62.5000 mL/h | INTRAVENOUS | Status: DC

## 2015-01-19 MED ORDER — SODIUM CHLORIDE 0.9 % IV SOLN
2.0000 g | Freq: Four times a day (QID) | INTRAVENOUS | Status: DC
  Administered 2015-01-19: 2 g via INTRAVENOUS
  Filled 2015-01-19 (×4): qty 2000

## 2015-01-19 NOTE — Progress Notes (Signed)
Patient ID: Michele Larson, female   DOB: [birth year], 24 y.o.   MRN: [id number]  In room with patient to evaluate pushing effort.  Began pushing at 2000.   O: Filed Vitals:   01/19/15 2211  BP:   Pulse:   Temp: 100.6 F (38.1 C)  Resp:    FHR 160-170's, +accels, mod variability; decels after contractions VE:  Complete/+1; small amount of caput seen with pushing  A:   Second Stage Category II Maternal Temp  P: Begin Amp and Gen 1 Gram Tylenol Dr. Macon Large in to evaluate patient Michele Larson, CNM

## 2015-01-19 NOTE — Progress Notes (Signed)
Patient ID: [name], female   DOB: 1991-04-20, 24 y.o.   MRN: [id number] Michele Larson is a 24 y.o. G1P0 at [redacted]w[redacted]d admitted for rupture of membranes, early labor  Subjective: Comfortable w/ epidural  Objective: BP 128/65 mmHg  Pulse 88  Temp(Src) 98.2 F (36.8 C) (Oral)  Resp 16  Ht 4\' 9"  (1.448 m)  Wt 63.277 kg (139 lb 8 oz)  BMI 30.18 kg/m2  SpO2 98%  LMP 04/04/2014    FHT:  FHR: 135 bpm, variability: moderate,  accelerations:  Present,  decelerations:  Absent UC:   q 6-34mins  SVE:   Dilation: 5 Effacement (%): 80 Station: -2 Exam by:: Michele Larson, Michele Larson, CNM  Labs: Lab Results  Component Value Date   WBC 14.4* 01/19/2015   HGB 13.0 01/19/2015   HCT 38.9 01/19/2015   MCV 74.8* 01/19/2015   PLT 222 01/19/2015    Assessment / Plan: spontaneous labor, slow progression w/ +HepB and SROM since 0000, will begin pitocin per protocol  Labor: normal Fetal Wellbeing:  Category I Pain Control:  Epidural Pre-eclampsia: n/a I/D:  n/a Anticipated MOD:  NSVD  Michele Larson CNM, WHNP-BC 01/19/2015, 7:25 AM

## 2015-01-19 NOTE — Progress Notes (Signed)
Patient ID: Michele Larson, female   DOB: 08/26/1990, 24 y.o.   MRN: [id number] Michele Larson is a 24 y.o. G1P0 at [redacted]w[redacted]d.  Subjective: Mild pressure w/ UC's  Objective: BP 120/72 mmHg  Pulse 89  Temp(Src) 98.3 F (36.8 C) (Oral)  Resp 20  Ht 4\' 9"  (1.448 m)  Wt 139 lb 8 oz (63.277 kg)  BMI 30.18 kg/m2  SpO2 98%  LMP 04/04/2014   FHT:  FHR: 125 bpm, variability: mod,  accelerations:  15x15,  decelerations:  none UC:   Q 2-3 minutes, strong  Dilation: 10 Dilation Complete Date: 01/19/15 Dilation Complete Time: 1852 Effacement (%): 100 Cervical Position: Middle Station: 0 Presentation: Vertex Exam by:: Dorathy Kinsman, CNM Attempted pushing. Pt unable to feel UC's or push effectively.   Labs: Results for orders placed or performed during the hospital encounter of 01/19/15 (from the past 24 hour(s))  Fern Test     Status: None   Collection Time: 01/19/15  2:27 AM  Result Value Ref Range   POCT Fern Test Positive = ruptured amniotic membanes   CBC     Status: Abnormal   Collection Time: 01/19/15  3:10 AM  Result Value Ref Range   WBC 14.4 (H) 4.0 - 10.5 K/uL   RBC 5.20 (H) 3.87 - 5.11 MIL/uL   Hemoglobin 13.0 12.0 - 15.0 g/dL   HCT 31.5 94.5 - 85.9 %   MCV 74.8 (L) 78.0 - 100.0 fL   MCH 25.0 (L) 26.0 - 34.0 pg   MCHC 33.4 30.0 - 36.0 g/dL   RDW 29.2 44.6 - 28.6 %   Platelets 222 150 - 400 K/uL  Type and screen     Status: None   Collection Time: 01/19/15  3:10 AM  Result Value Ref Range   ABO/RH(D) B POS    Antibody Screen NEG    Sample Expiration 01/22/2015   RPR     Status: None   Collection Time: 01/19/15  3:10 AM  Result Value Ref Range   RPR Ser Ql Non Reactive Non Reactive  HIV antibody     Status: None   Collection Time: 01/19/15  3:10 AM  Result Value Ref Range   HIV Screen 4th Generation wRfx Non Reactive Non Reactive  ABO/Rh     Status: None   Collection Time: 01/19/15  3:10 AM  Result Value Ref Range   ABO/RH(D) B POS     Assessment / Plan: [redacted]w[redacted]d  week IUP Labor: Second stage Fetal Wellbeing:  Category I Pain Control:  Epidural Anticipated MOD:  SVD Will labor down one hour.   [place], [place] 01/19/2015 9:17 PM

## 2015-01-19 NOTE — Anesthesia Preprocedure Evaluation (Signed)
Anesthesia Evaluation  Patient identified by MRN, date of birth, ID band Patient awake    Reviewed: Allergy & Precautions, H&P , NPO status , Patient's Chart, lab work & pertinent test results  History of Anesthesia Complications Negative for: history of anesthetic complications  Airway Mallampati: II  TM Distance: >3 FB Neck ROM: full    Dental no notable dental hx. (+) Teeth Intact   Pulmonary neg pulmonary ROS,  breath sounds clear to auscultation  Pulmonary exam normal       Cardiovascular negative cardio ROS Normal cardiovascular examRhythm:regular Rate:Normal     Neuro/Psych negative neurological ROS  negative psych ROS   GI/Hepatic negative GI ROS, Neg liver ROS,   Endo/Other  negative endocrine ROS  Renal/GU negative Renal ROS  negative genitourinary   Musculoskeletal   Abdominal   Peds  Hematology negative hematology ROS (+)   Anesthesia Other Findings   Reproductive/Obstetrics (+) Pregnancy                             Anesthesia Physical Anesthesia Plan  ASA: II  Anesthesia Plan: Epidural   Post-op Pain Management:    Induction:   Airway Management Planned:   Additional Equipment:   Intra-op Plan:   Post-operative Plan:   Informed Consent: I have reviewed the patients History and Physical, chart, labs and discussed the procedure including the risks, benefits and alternatives for the proposed anesthesia with the patient or authorized representative who has indicated his/her understanding and acceptance.     Plan Discussed with:   Anesthesia Plan Comments:         Anesthesia Quick Evaluation  

## 2015-01-19 NOTE — Progress Notes (Signed)
Patient ID: Michele Larson, female   DOB: October 02, 1990, 24 y.o.   MRN: [id number] Michele Larson is a 24 y.o. G1P0 at [redacted]w[redacted]d.  Subjective: Comfortable w/ epidural.  Objective: BP 121/74 mmHg  Pulse 87  Temp(Src) 98.7 F (37.1 C) (Oral)  Resp 18  Ht 4\' 9"  (1.448 m)  Wt 139 lb 8 oz (63.277 kg)  BMI 30.18 kg/m2  SpO2 98%  LMP 04/04/2014   FHT:  FHR: 145 bpm, variability: mod,  accelerations:  15x15,  decelerations:  none UC:   Q 2-4 minutes, moderate  Dilation: 7 Effacement (%): 80 Cervical Position: Middle Station: 0 Presentation: Vertex Exam by:: Dorathy Kinsman, CNM  Labs: Results for orders placed or performed during the hospital encounter of 01/19/15 (from the past 24 hour(s))  Fern Test     Status: None   Collection Time: 01/19/15  2:27 AM  Result Value Ref Range   POCT Fern Test Positive = ruptured amniotic membanes   CBC     Status: Abnormal   Collection Time: 01/19/15  3:10 AM  Result Value Ref Range   WBC 14.4 (H) 4.0 - 10.5 K/uL   RBC 5.20 (H) 3.87 - 5.11 MIL/uL   Hemoglobin 13.0 12.0 - 15.0 g/dL   HCT 03.4 74.2 - 59.5 %   MCV 74.8 (L) 78.0 - 100.0 fL   MCH 25.0 (L) 26.0 - 34.0 pg   MCHC 33.4 30.0 - 36.0 g/dL   RDW 63.8 75.6 - 43.3 %   Platelets 222 150 - 400 K/uL  Type and screen     Status: None   Collection Time: 01/19/15  3:10 AM  Result Value Ref Range   ABO/RH(D) B POS    Antibody Screen NEG    Sample Expiration 01/22/2015   RPR     Status: None   Collection Time: 01/19/15  3:10 AM  Result Value Ref Range   RPR Ser Ql Non Reactive Non Reactive  ABO/Rh     Status: None   Collection Time: 01/19/15  3:10 AM  Result Value Ref Range   ABO/RH(D) B POS     Assessment / Plan: [redacted]w[redacted]d week IUP Labor: Active Fetal Wellbeing:  Category I Pain Control:  epidural Anticipated MOD:  SVD Considering IUPC if labor progress stalls   [place], CNM 01/19/2015 2:26 PM

## 2015-01-19 NOTE — Progress Notes (Signed)
ANTIBIOTIC CONSULT NOTE - INITIAL  Pharmacy Consult for Gentamicin Indication: Maternal temperature  No Known Allergies  Patient Measurements: Height: 4\' 9"  (144.8 cm) Weight: 139 lb 8 oz (63.277 kg) IBW/kg (Calculated) : 38.6 Adjusted Body Weight:46 kg  Vital Signs: Temp: 100.6 F (38.1 C) (06/19 2211) Temp Source: Axillary (06/19 2211) BP: 122/66 mmHg (06/19 2200) Pulse Rate: 84 (06/19 2200) Intake/Output from previous day:   Intake/Output from this shift: Total I/O In: -  Out: 425 [Urine:425]  Labs:  Recent Labs  01/19/15 0310  WBC 14.4*  HGB 13.0  PLT 222   CrCl cannot be calculated (Patient has no serum creatinine result on file.). No results for input(s): VANCOTROUGH, VANCOPEAK, VANCORANDOM, GENTTROUGH, GENTPEAK, GENTRANDOM, TOBRATROUGH, TOBRAPEAK, TOBRARND, AMIKACINPEAK, AMIKACINTROU, AMIKACIN in the last 72 hours.   Microbiology: No results found for this or any previous visit (from the past 720 hour(s)).  Medical History: Past Medical History  Diagnosis Date  . Headache   . Hepatitis B affecting pregnancy     Medications:  Ampicillin 2 Gm IV every 6 hours  Assessment: 24 yo G1P0 at [redacted]w[redacted]d in second stage of labor, now with increased temperature.  Goal of Therapy:  Gentamicin peaks 6-8 mcg/ml; troughs <1 mcg/ml  Plan:  Gentamicin 120 mg IV loading dose; then gentamicin 100 mg IV every 8 hours Monitor serum creatinine per protocol Serum gentamicin levels if gentamicin is continued >72 hours or as indicated  Arelia Sneddon 01/19/2015,10:46 PM

## 2015-01-19 NOTE — Progress Notes (Signed)
Patient ID: Michele Larson, female   DOB: [birth year], 24 y.o.   MRN: [id number] Dr. Macon Large in with patient to discuss option of VAVD or csection; pt opts to try a VAVD. Eino Farber Kennith Gain, CNM

## 2015-01-19 NOTE — Progress Notes (Signed)
Patient ID: Michele Larson, female   DOB: 04-11-1991, 24 y.o.   MRN: [id number] Michele Larson is a 24 y.o. G1P0 at [redacted]w[redacted]d.  Subjective: Comfortable w/ epidural.  Objective: BP 118/78 mmHg  Pulse 76  Temp(Src) 98.9 F (37.2 C) (Oral)  Resp 18  Ht 4\' 9"  (1.448 m)  Wt 139 lb 8 oz (63.277 kg)  BMI 30.18 kg/m2  SpO2 98%  LMP 04/04/2014   FHT:  FHR: 145 bpm, variability: mod,  accelerations:  15x15,  decelerations:  Variable decelerations, some prolonged to 2-3 minutes resolved with position changes -> Category I UC:   Q 2-4 minutes, moderate  Pitocin is 18 mu/min Dilation: 7 Effacement (%): 80 Cervical Position: Middle Station: 0 Presentation: Vertex Exam by:: Michele Larson  Labs: Results for orders placed or performed during the hospital encounter of 01/19/15 (from the past 24 hour(s))  Fern Test     Status: None   Collection Time: 01/19/15  2:27 AM  Result Value Ref Range   POCT Fern Test Positive = ruptured amniotic membanes   CBC     Status: Abnormal   Collection Time: 01/19/15  3:10 AM  Result Value Ref Range   WBC 14.4 (H) 4.0 - 10.5 K/uL   RBC 5.20 (H) 3.87 - 5.11 MIL/uL   Hemoglobin 13.0 12.0 - 15.0 g/dL   HCT 19.4 17.4 - 08.1 %   MCV 74.8 (L) 78.0 - 100.0 fL   MCH 25.0 (L) 26.0 - 34.0 pg   MCHC 33.4 30.0 - 36.0 g/dL   RDW 44.8 18.5 - 63.1 %   Platelets 222 150 - 400 K/uL  Type and screen     Status: None   Collection Time: 01/19/15  3:10 AM  Result Value Ref Range   ABO/RH(D) B POS    Antibody Screen NEG    Sample Expiration 01/22/2015   RPR     Status: None   Collection Time: 01/19/15  3:10 AM  Result Value Ref Range   RPR Ser Ql Non Reactive Non Reactive  HIV antibody     Status: None   Collection Time: 01/19/15  3:10 AM  Result Value Ref Range   HIV Screen 4th Generation wRfx Non Reactive Non Reactive  ABO/Rh     Status: None   Collection Time: 01/19/15  3:10 AM  Result Value Ref Range   ABO/RH(D) B POS     Assessment / Plan: [redacted]w[redacted]d week IUP Labor:  Prolonged active phase for now, may consider IUPC if no change with next exam. Continue pitocin per protocol. Fetal Wellbeing:  Category II resolved to Category I with position changes, will continue to observe Pain Control:  Epidural Anticipated MOD:  SVD    Michele Newcomer, MD 01/19/2015 4:56 PM

## 2015-01-19 NOTE — MAU Note (Signed)
Having contractions every two to three min- going on since yesterday but worsened at 7 pm.  ?Water broke at midnight clear fluid went thru underwear and ran down her legs.  Baby not moving,  movement was Saturday morning.  No bleeding.

## 2015-01-19 NOTE — H&P (Signed)
Brytani Shorten is a 24 y.o. G1P0 female at [redacted]w[redacted]d by 19wk u/s, presenting w/ report of uc's and srom clear fluid at 2400, decreased fm.   Reports decreased  fetal movement, contractions: regular, vaginal bleeding: none, membranes: ruptured, clear fluid. Initiated prenatal care at [facility] at 20.4 wks.   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  Past Medical History: Past Medical History  Diagnosis Date  . Headache     Past Surgical History: Past Surgical History  Procedure Laterality Date  . No past surgeries      Obstetrical History: OB History    Gravida Para Term Preterm AB TAB SAB Ectopic Multiple Living   1               Social History: History   Social History  . Marital Status: Single    Spouse Name: N/A  . Number of Children: N/A  . Years of Education: N/A   Social History Main Topics  . Smoking status: Never Smoker   . Smokeless tobacco: Not on file  . Alcohol Use: No  . Drug Use: No  . Sexual Activity: Not on file   Other Topics Concern  . None   Social History Narrative    Family History: History reviewed. No pertinent family history.  Allergies: No Known Allergies  Prescriptions prior to admission  Medication Sig Dispense Refill Last Dose  . phenol (CHLORASEPTIC) 1.4 % LIQD Use as directed 1 spray in the mouth or throat as needed for throat irritation / pain. 1 Bottle 0 Past Week at Unknown time  . Prenatal Vit-Fe Fumarate-FA (PRENATAL MULTIVITAMIN) TABS tablet Take 1 tablet by mouth daily at 12 noon.   01/19/2015 at Unknown time  . guaiFENesin (MUCINEX) 600 MG 12 hr tablet Take 1 tablet (600 mg total) by mouth 2 (two) times daily. (Patient not taking: Reported on 01/14/2015) 30 tablet 0 Not Taking     Review of Systems  Pertinent pos/neg as indicated in HPI    Blood pressure 111/72, pulse 85, temperature 98.7 F (37.1 C), temperature source Oral, resp. rate 16, height 4\' 9"  (1.448 m), weight 63.277 kg (139 lb 8 oz), last menstrual period  04/04/2014, SpO2 99 %. General appearance: alert, cooperative and no distress Lungs: clear to auscultation bilaterally Heart: regular rate and rhythm Abdomen: gravid, soft, non-tender Extremities: trace edema DTR's 2+  Fetal monitoring: FHR: 140 bpm, variability: moderate,  Accelerations: Present,  decelerations:  Absent Uterine activity: q 2-81mins  Dilation: 4 Effacement (%): 90 Station: -2 Exam by:: Orinda Kenner RN, Benji RN Presentation: cephalic   Prenatal labs: ABO, Rh: B/Positive/-- (01/25 0000) Antibody: Negative (01/25 0000) Rubella:   RPR: Nonreactive (01/25 0000)  HBsAg: Positive (01/25 0000)  HIV: Non-reactive (01/25 0000)  GBS: Negative (05/16 0000)   1 hr Glucola: 146, 3hr: 73/154/121/123 Genetic screening:  Quad neg Anatomy US: normal  Results for orders placed or performed during the hospital encounter of 01/19/15 (from the past 24 hour(s))  [employer] Time: 01/19/15  2:27 AM  Result Value Ref Range   POCT Fern Test Positive = ruptured amniotic membanes      Assessment:  [redacted]w[redacted]d SIUP  G1P0  SROM clear fluid at 2400  Early labor   HBsAg +  Cat 1 FHR  GBS Negative (05/16 0000)   Late care @ 20wks  Plan:  Admit to BS  IV pain meds/epidural prn active labor  No FSE d/t + Hep B  Expectant management, pitocin if  needed for augmentation  Anticipate NSVB    [name] CNM, WHNP-BC 01/19/2015, 3:10 AM

## 2015-01-19 NOTE — Anesthesia Procedure Notes (Signed)
Epidural Patient location during procedure: OB  Staffing Anesthesiologist: Phillips Grout Performed by: anesthesiologist   Preanesthetic Checklist Completed: patient identified, site marked, surgical consent, pre-op evaluation, timeout performed, IV checked, risks and benefits discussed and monitors and equipment checked  Epidural Patient position: sitting Prep: DuraPrep Patient monitoring: heart rate, continuous pulse ox and blood pressure Approach: right paramedian Location: L4-L5 Injection technique: LOR saline  Needle:  Needle type: Tuohy  Needle gauge: 17 G Needle length: 9 cm and 9 Needle insertion depth: 5 cm Catheter type: closed end flexible Catheter size: 20 Guage Catheter at skin depth: 8 cm Test dose: negative  Assessment Events: blood not aspirated, injection not painful, no injection resistance, negative IV test and no paresthesia  Additional Notes Patient identified. Risks/Benefits/Options discussed with patient including but not limited to bleeding, infection, nerve damage, paralysis, failed block, incomplete pain control, headache, blood pressure changes, nausea, vomiting, reactions to medication both or allergic, itching and postpartum back pain. Confirmed with bedside nurse the patient's most recent platelet count. Confirmed with patient that they are not currently taking any anticoagulation, have any bleeding history or any family history of bleeding disorders. Patient expressed understanding and wished to proceed. All questions were answered. Sterile technique was used throughout the entire procedure. Please see nursing notes for vital signs. Test dose was given through epidural needle and negative prior to continuing to dose epidural or start infusion. Warning signs of high block given to the patient including shortness of breath, tingling/numbness in hands, complete motor block, or any concerning symptoms with instructions to call for help. Patient was given  instructions on fall risk and not to get out of bed. All questions and concerns addressed with instructions to call with any issues.

## 2015-01-20 ENCOUNTER — Encounter (HOSPITAL_COMMUNITY): Payer: Self-pay | Admitting: *Deleted

## 2015-01-20 MED ORDER — TETANUS-DIPHTH-ACELL PERTUSSIS 5-2.5-18.5 LF-MCG/0.5 IM SUSP
0.5000 mL | Freq: Once | INTRAMUSCULAR | Status: AC
  Administered 2015-01-20: 0.5 mL via INTRAMUSCULAR
  Filled 2015-01-20: qty 0.5

## 2015-01-20 MED ORDER — OXYCODONE-ACETAMINOPHEN 5-325 MG PO TABS
1.0000 | ORAL_TABLET | ORAL | Status: DC | PRN
Start: 2015-01-20 — End: 2015-01-21
  Administered 2015-01-21: 1 via ORAL
  Filled 2015-01-20: qty 1

## 2015-01-20 MED ORDER — WITCH HAZEL-GLYCERIN EX PADS: 1.0000 "application " | MEDICATED_PAD | CUTANEOUS | Status: DC | PRN

## 2015-01-20 MED ORDER — ONDANSETRON HCL 4 MG PO TABS: 4.0000 mg | ORAL_TABLET | ORAL | Status: DC | PRN

## 2015-01-20 MED ORDER — ONDANSETRON HCL 4 MG/2ML IJ SOLN: 4.0000 mg | INTRAMUSCULAR | Status: DC | PRN

## 2015-01-20 MED ORDER — ZOLPIDEM TARTRATE 5 MG PO TABS: 5.0000 mg | ORAL_TABLET | Freq: Every evening | ORAL | Status: DC | PRN

## 2015-01-20 MED ORDER — DIPHENHYDRAMINE HCL 25 MG PO CAPS: 25.0000 mg | ORAL_CAPSULE | Freq: Four times a day (QID) | ORAL | Status: DC | PRN

## 2015-01-20 MED ORDER — SENNOSIDES-DOCUSATE SODIUM 8.6-50 MG PO TABS
2.0000 | ORAL_TABLET | ORAL | Status: DC
  Administered 2015-01-20: 2 via ORAL
  Filled 2015-01-20: qty 2

## 2015-01-20 MED ORDER — PRENATAL MULTIVITAMIN CH
1.0000 | ORAL_TABLET | Freq: Every day | ORAL | Status: DC
  Administered 2015-01-20 – 2015-01-21 (×2): 1 via ORAL
  Filled 2015-01-20 (×2): qty 1

## 2015-01-20 MED ORDER — IBUPROFEN 600 MG PO TABS
600.0000 mg | ORAL_TABLET | Freq: Four times a day (QID) | ORAL | Status: DC
  Administered 2015-01-20 – 2015-01-21 (×7): 600 mg via ORAL
  Filled 2015-01-20 (×7): qty 1

## 2015-01-20 MED ORDER — SIMETHICONE 80 MG PO CHEW: 80.0000 mg | CHEWABLE_TABLET | ORAL | Status: DC | PRN

## 2015-01-20 MED ORDER — DIBUCAINE 1 % RE OINT: 1.0000 "application " | TOPICAL_OINTMENT | RECTAL | Status: DC | PRN

## 2015-01-20 MED ORDER — LANOLIN HYDROUS EX OINT: TOPICAL_OINTMENT | CUTANEOUS | Status: DC | PRN

## 2015-01-20 MED ORDER — OXYCODONE-ACETAMINOPHEN 5-325 MG PO TABS
2.0000 | ORAL_TABLET | ORAL | Status: DC | PRN
  Administered 2015-01-20: 2 via ORAL
  Filled 2015-01-20: qty 2

## 2015-01-20 MED ORDER — ACETAMINOPHEN 325 MG PO TABS: 650.0000 mg | ORAL_TABLET | ORAL | Status: DC | PRN

## 2015-01-20 MED ORDER — BENZOCAINE-MENTHOL 20-0.5 % EX AERO
1.0000 "application " | INHALATION_SPRAY | CUTANEOUS | Status: DC | PRN
  Administered 2015-01-20 (×2): 1 via TOPICAL
  Filled 2015-01-20 (×2): qty 56

## 2015-01-20 NOTE — Progress Notes (Signed)
Patient ID: [name], female   DOB: 1991-04-16, 24 y.o.   MRN: [id number] Post Partum Day #1 Subjective:  Michele Larson is a 24 y.o. G1P1001 [redacted]w[redacted]d s/p VAVD.  No acute events overnight.  Pt denies problems with ambulating, voiding or po intake.  She denies nausea or vomiting.  Pain is well controlled.  She has had flatus. She has not had bowel movement.  Lochia Moderate.  Plan for birth control is OCP vs Nexplanon.  Method of Feeding: bottle  Objective: Blood pressure 103/73, pulse 71, temperature 98.3 F (36.8 C), temperature source Oral, resp. rate 18, height 4\' 9"  (1.448 m), weight 63.277 kg (139 lb 8 oz), last menstrual period 04/04/2014, SpO2 99 %, unknown if currently breastfeeding.  Physical Exam:  General: alert, cooperative and no distress Chest: CTAB Heart: RRR no m/r/g Abdomen: +BS, soft, nontender,  Uterine Fundus: firm DVT Evaluation: No evidence of DVT seen on physical exam. Extremities: no edema   Recent Labs  01/19/15 0310  HGB 13.0  HCT 38.9    Assessment/Plan:  ASSESSMENT: Michele Larson is a 24 y.o. G1P1001 [redacted]w[redacted]d s/p VAVD. Doing well on PPD#1. Remains afebrile. Infant is still in the NICU given respiratory distress after shoulder dystocia with delivery.   Plan for discharge tomorrow and Contraception nexplanon vs OCP   LOS: 1 day   Fabio Asa 01/20/2015, 7:36 AM   I examined pt and agree with documentation above and resident plan of care. Eino Farber Kennith Gain, CNM

## 2015-01-20 NOTE — Anesthesia Postprocedure Evaluation (Signed)
  Anesthesia Post-op Note  Patient: Michele Larson  Procedure(s) Performed: * No procedures listed *  Patient Location: PACU and Women's Unit  Anesthesia Type:Epidural  Level of Consciousness: awake, alert  and oriented  Airway and Oxygen Therapy: Patient Spontanous Breathing  Post-op Pain: none  Post-op Assessment: Post-op Vital signs reviewed and Patient's Cardiovascular Status Stable              Post-op Vital Signs: Reviewed and stable  Last Vitals:  Filed Vitals:   01/20/15 0608  BP: 103/73  Pulse: 71  Temp: 36.8 C  Resp: 18    Complications: No apparent anesthesia complications

## 2015-01-20 NOTE — Lactation Note (Signed)
This note was copied from the chart of Michele Dalia Szczesny. Lactation Consultation Note  Initial visit done.  Baby is 12 hours old and in the NICU following a code apgar at delivery.  [facility] pump is at bedside and mom states she has pumped once.  She did not obtain colostrum with first pumping.  She states she has been instructed on hand expression.  Instructed to pump every 3 hours for 15 minutes followed by hand expression.  Milk coming to volume discussed.  Mom states her WIC has expired but she just talked to them per phone.  Pump referral faxed to [facility] office.  Encouraged to call with concerns prn.  Patient Name: Michele Larson JOINO'M Date: 01/20/2015 Reason for consult: Initial assessment;NICU baby   Maternal Data    Feeding Feeding Type: Bottle Fed - Formula Nipple Type: Slow - flow Length of feed: 20 min  LATCH Score/Interventions                      Lactation Tools Discussed/Used Pump Review: Setup, frequency, and cleaning;Milk Storage Initiated by:: RN Date initiated:: 01/20/15   Consult Status Consult Status: Follow-up Date: 01/21/15 Follow-up type: In-patient    Huston Foley 01/20/2015, 11:22 AM

## 2015-01-21 DIAGNOSIS — Z8759 Personal history of other complications of pregnancy, childbirth and the puerperium: Secondary | ICD-10-CM

## 2015-01-21 MED ORDER — IBUPROFEN 600 MG PO TABS: 600.0000 mg | ORAL_TABLET | Freq: Four times a day (QID) | ORAL | Status: DC

## 2015-01-21 NOTE — Discharge Instructions (Signed)
Breastfeeding °Deciding to breastfeed is one of the best choices you can make for you and your baby. A change in hormones during pregnancy causes your breast tissue to grow and increases the number and size of your milk ducts. These hormones also allow proteins, sugars, and fats from your blood supply to make breast milk in your milk-producing glands. Hormones prevent breast milk from being released before your baby is born as well as prompt milk flow after birth. Once breastfeeding has begun, thoughts of your baby, as well as his or her sucking or crying, can stimulate the release of milk from your milk-producing glands.  °BENEFITS OF BREASTFEEDING °For Your Baby °· Your first milk (colostrum) helps your baby's digestive system function better.   °· There are antibodies in your milk that help your baby fight off infections.   °· Your baby has a lower incidence of asthma, allergies, and sudden infant death syndrome.   °· The nutrients in breast milk are better for your baby than infant formulas and are designed uniquely for your baby's needs.   °· Breast milk improves your baby's brain development.   °· Your baby is less likely to develop other conditions, such as childhood obesity, asthma, or type 2 diabetes mellitus.   °For You  °· Breastfeeding helps to create a very special bond between you and your baby.   °· Breastfeeding is convenient. Breast milk is always available at the correct temperature and costs nothing.   °· Breastfeeding helps to burn calories and helps you lose the weight gained during pregnancy.   °· Breastfeeding makes your uterus contract to its prepregnancy size faster and slows bleeding (lochia) after you give birth.   °· Breastfeeding helps to lower your risk of developing type 2 diabetes mellitus, osteoporosis, and breast or ovarian cancer later in life. °SIGNS THAT YOUR BABY IS HUNGRY °Early Signs of Hunger  °· Increased alertness or activity. °· Stretching. °· Movement of the head from  side to side. °· Movement of the head and opening of the mouth when the corner of the mouth or cheek is stroked (rooting). °· Increased sucking sounds, smacking lips, cooing, sighing, or squeaking. °· Hand-to-mouth movements. °· Increased sucking of fingers or hands. °Late Signs of Hunger °· Fussing. °· Intermittent crying. °Extreme Signs of Hunger °Signs of extreme hunger will require calming and consoling before your baby will be able to breastfeed successfully. Do not wait for the following signs of extreme hunger to occur before you initiate breastfeeding:   °· Restlessness. °· A loud, strong cry. °·  Screaming. °BREASTFEEDING BASICS °Breastfeeding Initiation °· Find a comfortable place to sit or lie down, with your neck and back well supported. °· Place a pillow or rolled up blanket under your baby to bring him or her to the level of your breast (if you are seated). Nursing pillows are specially designed to help support your arms and your baby while you breastfeed. °· Make sure that your baby's abdomen is facing your abdomen.   °· Gently massage your breast. With your fingertips, massage from your chest wall toward your nipple in a circular motion. This encourages milk flow. You may need to continue this action during the feeding if your milk flows slowly. °· Support your breast with 4 fingers underneath and your thumb above your nipple. Make sure your fingers are well away from your nipple and your baby's mouth.   °· Stroke your baby's lips gently with your finger or nipple.   °· When your baby's mouth is open wide enough, quickly bring your baby to your   breast, placing your entire nipple and as much of the colored area around your nipple (areola) as possible into your baby's mouth.   °¨ More areola should be visible above your baby's upper lip than below the lower lip.   °¨ Your baby's tongue should be between his or her lower gum and your breast.   °· Ensure that your baby's mouth is correctly positioned  around your nipple (latched). Your baby's lips should create a seal on your breast and be turned out (everted). °· It is common for your baby to suck about 2-3 minutes in order to start the flow of breast milk. °Latching °Teaching your baby how to latch on to your breast properly is very important. An improper latch can cause nipple pain and decreased milk supply for you and poor weight gain in your baby. Also, if your baby is not latched onto your nipple properly, he or she may swallow some air during feeding. This can make your baby fussy. Burping your baby when you switch breasts during the feeding can help to get rid of the air. However, teaching your baby to latch on properly is still the best way to prevent fussiness from swallowing air while breastfeeding. °Signs that your baby has successfully latched on to your nipple:    °· Silent tugging or silent sucking, without causing you pain.   °· Swallowing heard between every 3-4 sucks.   °·  Muscle movement above and in front of his or her ears while sucking.   °Signs that your baby has not successfully latched on to nipple:  °· Sucking sounds or smacking sounds from your baby while breastfeeding. °· Nipple pain. °If you think your baby has not latched on correctly, slip your finger into the corner of your baby's mouth to break the suction and place it between your baby's gums. Attempt breastfeeding initiation again. °Signs of Successful Breastfeeding °Signs from your baby:   °· A gradual decrease in the number of sucks or complete cessation of sucking.   °· Falling asleep.   °· Relaxation of his or her body.   °· Retention of a small amount of milk in his or her mouth.   °· Letting go of your breast by himself or herself. °Signs from you: °· Breasts that have increased in firmness, weight, and size 1-3 hours after feeding.   °· Breasts that are softer immediately after breastfeeding. °· Increased milk volume, as well as a change in milk consistency and color by  the fifth day of breastfeeding.   °· Nipples that are not sore, cracked, or bleeding. °Signs That Your Baby is Getting Enough Milk °· Wetting at least 3 diapers in a 24-hour period. The urine should be clear and pale yellow by age 5 days. °· At least 3 stools in a 24-hour period by age 5 days. The stool should be soft and yellow. °· At least 3 stools in a 24-hour period by age 7 days. The stool should be seedy and yellow. °· No loss of weight greater than 10% of birth weight during the first 3 days of age. °· Average weight gain of 4-7 ounces (113-198 g) per week after age 4 days. °· Consistent daily weight gain by age 5 days, without weight loss after the age of 2 weeks. °After a feeding, your baby may spit up a small amount. This is common. °BREASTFEEDING FREQUENCY AND DURATION °Frequent feeding will help you make more milk and can prevent sore nipples and breast engorgement. Breastfeed when you feel the need to reduce the fullness of your breasts   or when your baby shows signs of hunger. This is called "breastfeeding on demand." Avoid introducing a pacifier to your baby while you are working to establish breastfeeding (the first 4-6 weeks after your baby is born). After this time you may choose to use a pacifier. Research has shown that pacifier use during the first year of a baby's life decreases the risk of sudden infant death syndrome (SIDS). °Allow your baby to feed on each breast as long as he or she wants. Breastfeed until your baby is finished feeding. When your baby unlatches or falls asleep while feeding from the first breast, offer the second breast. Because newborns are often sleepy in the first few weeks of life, you may need to awaken your baby to get him or her to feed. °Breastfeeding times will vary from baby to baby. However, the following rules can serve as a guide to help you ensure that your baby is properly fed: °· Newborns (babies 4 weeks of age or younger) may breastfeed every 1-3  hours. °· Newborns should not go longer than 3 hours during the day or 5 hours during the night without breastfeeding. °· You should breastfeed your baby a minimum of 8 times in a 24-hour period until you begin to introduce solid foods to your baby at around 6 months of age. °BREAST MILK PUMPING °Pumping and storing breast milk allows you to ensure that your baby is exclusively fed your breast milk, even at times when you are unable to breastfeed. This is especially important if you are going back to work while you are still breastfeeding or when you are not able to be present during feedings. Your lactation consultant can give you guidelines on how long it is safe to store breast milk.  °A breast pump is a machine that allows you to pump milk from your breast into a sterile bottle. The pumped breast milk can then be stored in a refrigerator or freezer. Some breast pumps are operated by hand, while others use electricity. Ask your lactation consultant which type will work best for you. Breast pumps can be purchased, but some hospitals and breastfeeding support groups lease breast pumps on a monthly basis. A lactation consultant can teach you how to hand express breast milk, if you prefer not to use a pump.  °CARING FOR YOUR BREASTS WHILE YOU BREASTFEED °Nipples can become dry, cracked, and sore while breastfeeding. The following recommendations can help keep your breasts moisturized and healthy: °· Avoid using soap on your nipples.   °· Wear a supportive bra. Although not required, special nursing bras and tank tops are designed to allow access to your breasts for breastfeeding without taking off your entire bra or top. Avoid wearing underwire-style bras or extremely tight bras. °· Air dry your nipples for 3-4 minutes after each feeding.   °· Use only cotton bra pads to absorb leaked breast milk. Leaking of breast milk between feedings is normal.   °· Use lanolin on your nipples after breastfeeding. Lanolin helps to  maintain your skin's normal moisture barrier. If you use pure lanolin, you do not need to wash it off before feeding your baby again. Pure lanolin is not toxic to your baby. You may also hand express a few drops of breast milk and gently massage that milk into your nipples and allow the milk to air dry. °In the first few weeks after giving birth, some women experience extremely full breasts (engorgement). Engorgement can make your breasts feel heavy, warm, and tender to the   touch. Engorgement peaks within 3-5 days after you give birth. The following recommendations can help ease engorgement: °· Completely empty your breasts while breastfeeding or pumping. You may want to start by applying warm, moist heat (in the shower or with warm water-soaked hand towels) just before feeding or pumping. This increases circulation and helps the milk flow. If your baby does not completely empty your breasts while breastfeeding, pump any extra milk after he or she is finished. °· Wear a snug bra (nursing or regular) or tank top for 1-2 days to signal your body to slightly decrease milk production. °· Apply ice packs to your breasts, unless this is too uncomfortable for you. °· Make sure that your baby is latched on and positioned properly while breastfeeding. °If engorgement persists after 48 hours of following these recommendations, contact your health care provider or a lactation consultant. °OVERALL HEALTH CARE RECOMMENDATIONS WHILE BREASTFEEDING °· Eat healthy foods. Alternate between meals and snacks, eating 3 of each per day. Because what you eat affects your breast milk, some of the foods may make your baby more irritable than usual. Avoid eating these foods if you are sure that they are negatively affecting your baby. °· Drink milk, fruit juice, and water to satisfy your thirst (about 10 glasses a day).   °· Rest often, relax, and continue to take your prenatal vitamins to prevent fatigue, stress, and anemia. °· Continue  breast self-awareness checks. °· Avoid chewing and smoking tobacco. °· Avoid alcohol and drug use. °Some medicines that may be harmful to your baby can pass through breast milk. It is important to ask your health care provider before taking any medicine, including all over-the-counter and prescription medicine as well as vitamin and herbal supplements. °It is possible to become pregnant while breastfeeding. If birth control is desired, ask your health care provider about options that will be safe for your baby. °SEEK MEDICAL CARE IF:  °· You feel like you want to stop breastfeeding or have become frustrated with breastfeeding. °· You have painful breasts or nipples. °· Your nipples are cracked or bleeding. °· Your breasts are red, tender, or warm. °· You have a swollen area on either breast. °· You have a fever or chills. °· You have nausea or vomiting. °· You have drainage other than breast milk from your nipples. °· Your breasts do not become full before feedings by the fifth day after you give birth. °· You feel sad and depressed. °· Your baby is too sleepy to eat well. °· Your baby is having trouble sleeping.   °· Your baby is wetting less than 3 diapers in a 24-hour period. °· Your baby has less than 3 stools in a 24-hour period. °· Your baby's skin or the white part of his or her eyes becomes yellow.   °· Your baby is not gaining weight by 5 days of age. °SEEK IMMEDIATE MEDICAL CARE IF:  °· Your baby is overly tired (lethargic) and does not want to wake up and feed. °· Your baby develops an unexplained fever. °Document Released: 07/19/2005 Document Revised: 07/24/2013 Document Reviewed: 01/10/2013 °Exit[employer]® Patient Information ©2015 [employer]. This information is not intended to replace advice given to you by your health care provider. Make sure you discuss any questions you have with your health care provider. ° ° °Postpartum Care After Vaginal Delivery °After you deliver your newborn (postpartum  period), the usual stay in the hospital is 24-72 hours. If there were problems with your labor or delivery, or if   you have other medical problems, you might be in the hospital longer.  °While you are in the hospital, you will receive help and instructions on how to care for yourself and your newborn during the postpartum period.  °While you are in the hospital: °· Be sure to tell your nurses if you have pain or discomfort, as well as where you feel the pain and what makes the pain worse. °· If you had an incision made near your vagina (episiotomy) or if you had some tearing during delivery, the nurses may put ice packs on your episiotomy or tear. The ice packs may help to reduce the pain and swelling. °· If you are breastfeeding, you may feel uncomfortable contractions of your uterus for a couple of weeks. This is normal. The contractions help your uterus get back to normal size. °· It is normal to have some bleeding after delivery. °· For the first 1-3 days after delivery, the flow is red and the amount may be similar to a period. °· It is common for the flow to start and stop. °· In the first few days, you may pass some small clots. Let your nurses know if you begin to pass large clots or your flow increases. °· Do not  flush blood clots down the toilet before having the nurse look at them. °· During the next 3-10 days after delivery, your flow should become more watery and pink or brown-tinged in color. °· Ten to fourteen days after delivery, your flow should be a small amount of yellowish-white discharge. °· The amount of your flow will decrease over the first few weeks after delivery. Your flow may stop in 6-8 weeks. Most women have had their flow stop by 12 weeks after delivery. °· You should change your sanitary pads frequently. °· Wash your hands thoroughly with soap and water for at least 20 seconds after changing pads, using the toilet, or before holding or feeding your newborn. °· You should feel like you  need to empty your bladder within the first 6-8 hours after delivery. °· In case you become weak, lightheaded, or faint, call your nurse before you get out of bed for the first time and before you take a shower for the first time. °· Within the first few days after delivery, your breasts may begin to feel tender and full. This is called engorgement. Breast tenderness usually goes away within 48-72 hours after engorgement occurs. You may also notice milk leaking from your breasts. If you are not breastfeeding, do not stimulate your breasts. Breast stimulation can make your breasts produce more milk. °· Spending as much time as possible with your newborn is very important. During this time, you and your newborn can feel close and get to know each other. Having your newborn stay in your room (rooming in) will help to strengthen the bond with your newborn.  It will give you time to get to know your newborn and become comfortable caring for your newborn. °· Your hormones change after delivery. Sometimes the hormone changes can temporarily cause you to feel sad or tearful. These feelings should not last more than a few days. If these feelings last longer than that, you should talk to your caregiver. °· If desired, talk to your caregiver about methods of family planning or contraception. °· Talk to your caregiver about immunizations. Your caregiver may want you to have the following immunizations before leaving the hospital: °· Tetanus, diphtheria, and pertussis (Tdap) or tetanus and diphtheria (Td)   immunization. It is very important that you and your family (including grandparents) or others caring for your newborn are up-to-date with the Tdap or Td immunizations. The Tdap or Td immunization can help protect your newborn from getting ill. °· Rubella immunization. °· Varicella (chickenpox) immunization. °· Influenza immunization. You should receive this annual immunization if you did not receive the immunization during  your pregnancy. °Document Released: 05/16/2007 Document Revised: 04/12/2012 Document Reviewed: 03/15/2012 °ExitCare® Patient Information ©2015 [employer]. This information is not intended to replace advice given to you by your health care provider. Make sure you discuss any questions you have with your health care provider. ° °

## 2015-01-21 NOTE — Discharge Summary (Signed)
Obstetric Discharge Summary Reason for Admission: rupture of membranes Prenatal Procedures: none Intrapartum Procedures: vacuum and episiotomy midline with extension to 3rd degree laceratiom Postpartum Procedures: none Complications-Operative and Postpartum: 3rd degree perineal laceration and shoulder dystocia lasting 1 minute 50 seconds. Infant to NICU after birth.     Operative Delivery Note At 11:14 PM a viable female was delivered via Vaginal, Vacuum Investment banker, operational). Presentation: vertex; Position: Right,, Occiput,, Transverse; Station: +3. Delivery complicated by shoulder dystocia that lasted for 1 minute and 50 seconds; resolved with the following maneuvers: Lysle Dingwall, suprapubic pressure, corkscrew. Infant handed over to awaiting NICU team.  APGAR: 2, 6, 8; weight 7 lb 2.8 oz (3255 g).  Placenta status: Intact, Spontaneous.  Cord: 3 vessels with the following complications: None. Arterial cord pH: 7.22  Anesthesia: Epidural  Episiotomy: Median Lacerations: Perineal;3rd degree Suture Repair: 0 and 3.0 vicryl Est. Blood Loss (mL): 300 ml  Hospital Course:  Active Problems:   Active labor at term   Status post vacuum-assisted vaginal delivery   Third degree laceration of perineum during delivery, postpartum   Michele Larson is a 24 y.o. G1P1001 s/p VAVD.  Patient was admitted on 01/19/15 after SROM; labor augmented with pitocin.  She has postpartum course that was uncomplicated including no problems with ambulating, PO intake, urination, pain, or bleeding. The pt feels ready to go home and  will be discharged with outpatient follow-up.   Today: No acute events overnight.  Pt denies problems with ambulating, voiding or po intake.  She denies nausea or vomiting.  Pain is well controlled.  She has had flatus. She has not had bowel movement.  Lochia Minimal.  Plan for birth control is nexplanon. She will call office for appointment. Method of Feeding: breast.bottle Per patient  report, infant is in the NICU for at least 7 days; currently on antibiotics.   Physical Exam:  Filed Vitals:   01/21/15 0640  BP: 123/79  Pulse: 74  Temp: 98.2 F (36.8 C)  Resp: 16  General: alert, cooperative and no distress  Chest: lungs CTAB CV: RRR no m/r/g Lochia: appropriate Uterine Fundus: firm DVT Evaluation: No cords or calf tenderness. No significant calf/ankle edema.  H/H: Lab Results  Component Value Date/Time   HGB 13.0 01/19/2015 03:10 AM   HCT 38.9 01/19/2015 03:10 AM    Discharge Diagnoses: Term Pregnancy-delivered  Discharge Information: Date: 01/21/2015 Activity: pelvic rest Diet: routine  Medications: Ibuprofen Breast feeding:  Yes Condition: stable Instructions: refer to handout Discharge to: home      Medication List    ASK your doctor about these medications        prenatal multivitamin Tabs tablet  Take 1 tablet by mouth daily at 12 noon.           Follow-up Information    Follow up with [facility] [place].   Why:  in 4-6 weeks for postpartum follow up   Contact information:   [address] [place] [address] [phone]      Fabio Asa ,MD OB Fellow 01/21/2015,7:02 AM

## 2015-01-21 NOTE — Progress Notes (Signed)
Pt  Ambulated out  Teaching complete   Sitz bath given and pt to go [facility] for   Breast pump

## 2015-01-21 NOTE — Clinical Social Work Maternal (Signed)
CLINICAL SOCIAL WORK MATERNAL/CHILD NOTE  Patient Details  Name: Towanda Octave MRN: [id number] Date of Birth: 05/10/1991  Date:  2014/09/08  Clinical Social Worker Initiating Note:  Deveney Bayon E. Maryruth Eve Date/ Time Initiated:  01/21/15/1000     Child's Name:  Suzy Bouchard Frediani-Calamari   Legal Guardian:   (Parents: Jonetta Dennie and Amoron Tubby)   Need for Interpreter:  None (MOB speaks English fluently and denied need for [place] interpreter, however, CSW feels there were times that language became a barrier to effective communication.  CSW ensured that MOB is aware that we can call an interpreter at any time if family de)   Date of Referral:        Reason for Referral:   (No referral-NICU admission)   Referral Source:      Address:   ([address] [place] 45-with maternal grandparents)  Phone number:  [phone]   Household Members:  Significant Other, Parents   Natural Supports (not living in the home):  Extended Family, Friends, Immediate Family   Professional Supports:     Employment:     Type of Work:  (FOB works at a [employer] secretary.  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, but plans to stay home with the baby for a while.)   Education:      Museum/gallery curator Resources:  [employer]   Other Resources:      Cultural/Religious Considerations Which May Impact Care: None stated  Strengths:  Ability to meet basic needs , Compliance with medical plan , Home prepared for child , Pediatrician chosen  (After conversation with CSW, MOB has chosen to take baby to [facility] on [address] for pediatric follow up.)   Risk Factors/Current Problems:  None   Cognitive State:  Alert , Linear Thinking    Mood/Affect:  Interested , Relaxed , Comfortable , Calm    CSW Assessment: CSW met with MOB in her third floor room/317 to introduce myself and complete assessment due to baby's admission to NICU at 40 weeks.  MOB was by herself and welcoming of CSW's  intervention.  She states she feels comfortable communicating in [place] and stated understanding that a [place] interpreter can be called at any time if she desires.  She reports feeling well.  MOB asked CSW how long her baby will need to remain in the hospital and how she is eating.  CSW encouraged her to ask baby's bedside RN for an update the next time she visits her baby, but informed her that from CSW's conversation with RN prior to meeting with MOB, baby's course of antibiotics has not yet been determined and baby is working on Designer, jewellery.   MOB reports having a good support system and states that she and FOB are in a relationship and this is their first baby.  She reports that they were living with FOB's sister, but have both moved in with her parents in [place] for additional support with the baby.  MOB is unsure how long they will stay at her parents' house.  MOB states she and FOB have all needed supplies for baby at home.  CSW provided safe sleep education, which MOB was very attentive to and seemingly interested in.   CSW provided education on signs and symptoms of PPD as well as common emotions related to the first two weeks after delivery.  MOB was easy to engage and commits to talking with CSW and or her doctor if she has concerns about her  emotions at any time.  She reports [facility].   CSW asked MOB if she has chosen a pediatrician for the baby, but she did not understand the question.  CSW explained the role of a pediatrician and MOB again informed CSW that [facility].  CSW explained that she could get a pediatrician list from the NICU or she could elect to have baby follow up with a doctor at [facility], the [facility] the [facility] for [facility].  MOB chose the latter.  CSW feels by the end of the conversation, MOB was understanding of baby's need for outpatient follow up.   She reports no questions, concerns or needs at this time.  CSW explained ongoing support services offered by NICU CSW and provided contact information, encouraging MOB to call any time.  MOB stated appreciation.     CSW Plan/Description:  Patient/Family Education , Psychosocial Support and Ongoing Assessment of Needs    Breunna Nordmann Elizabeth, LCSW 01/21/2015, 1:48 PM 

## 2015-01-22 NOTE — Addendum Note (Signed)
Addendum  created 01/22/15 1229 by Savana Spina M Domanique Luckett, RN   Modules edited: Anesthesia LDA, Lines/Drains/Airways Properties Editor   Lines/Drains/Airways Properties Editor:  Properties of line/drain/airway/wound [REMOVED] Perineural (Nerve Sheath) Catheter 01/19/15 have been modified.    

## 2015-01-23 ENCOUNTER — Ambulatory Visit: Payer: Self-pay

## 2015-01-23 NOTE — Lactation Note (Signed)
This note was copied from the chart of Michele Lavra Puglia. Lactation Consultation Note  Patient Name: Michele Larson Date: 01/23/2015 Reason for consult: Follow-up assessment with this mom of a term baby in NICU, now almost 22 days old. Mom has not been pumping frequently enough, but did bring in about 20 -30 ml's of EBM the baby latched and breastfed today for the first time, and did well. She was fussy with latching until she was syringe fed a couple of ml's of EBm,and then she latched easily, with strong suckles and visible swallows. Mom came in full, and felt softer after feeding. Baby fed in cross cradle, at least 10 minutes on each breast, and the self un-latched, calm and satiated looking.  Mom and baby are rooming in [place]. I advised mom to BF with cures, and to pump about 15 minutes every 3 hours, to potect her supply and have EBM instead of formula to ofer as supplement. Basic breast feeding teahcing done on breastfeeding, milk storage, # of wet and dirty diapers, cluster feeding.  [place] interpreter present during consult.    Maternal Data    Feeding Feeding Type: Breast Fed Length of feed: 20 min  LATCH Score/Interventions Latch: Repeated attempts needed to sustain latch, nipple held in mouth throughout feeding, stimulation needed to elicit sucking reflex. (baby latches after a couple of ml's EBm syringe fed slowly into her mouth. she is used to bottle feeding) Intervention(s): Adjust position;Assist with latch  Audible Swallowing: Spontaneous and intermittent  Type of Nipple: Everted at rest and after stimulation  Comfort (Breast/Nipple): Filling, red/small blisters or bruises, mild/mod discomfort  Problem noted: Filling  Hold (Positioning): Assistance needed to correctly position infant at breast and maintain latch. Intervention(s): Breastfeeding basics reviewed;Support Pillows;Position options;Skin to skin  LATCH Score: 7  Lactation Tools  Discussed/Used Pump Review: Setup, frequency, and cleaning   Consult Status Consult Status: Follow-up Date: 01/24/15 Follow-up type: In-patient    Michele Larson 01/23/2015, 2:54 PM

## 2015-06-09 IMAGING — US US OB COMP +14 WK
2 series · 12 of 28 positions shown · non-contrast
Comparison: none

[Series 1: us ob comp +14 wk mfm · 9 of 69 slices shown (1 of 2)]
[im 4/69]
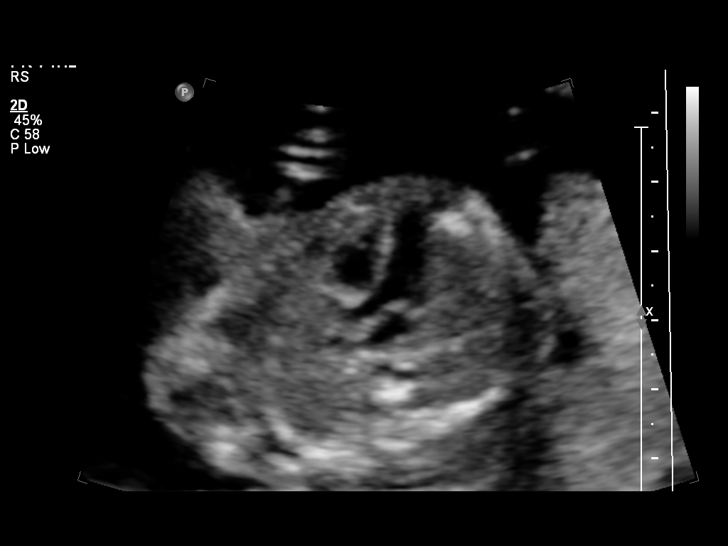
[im 10/69]
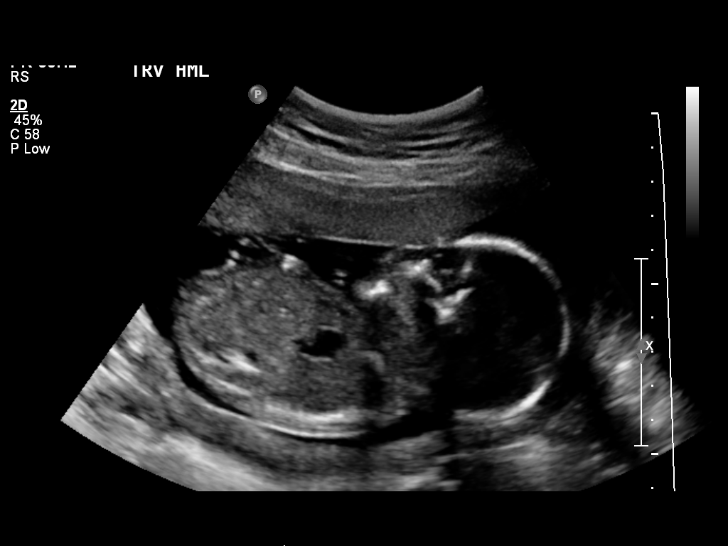
[im 17/69]
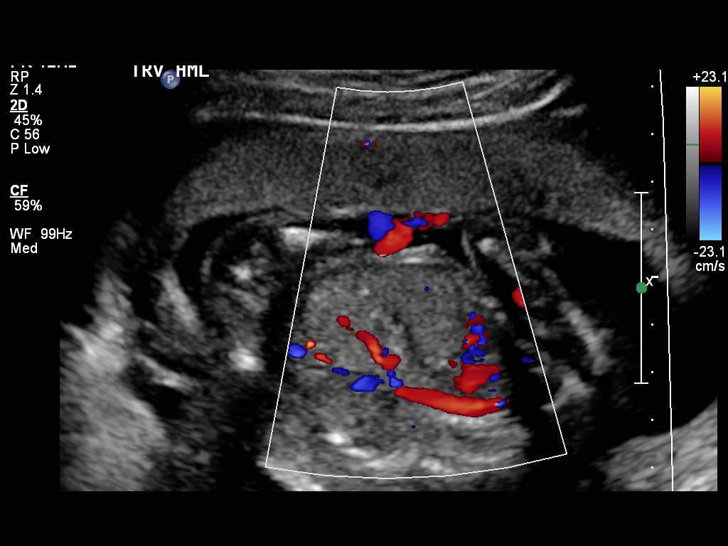
[im 26/69]
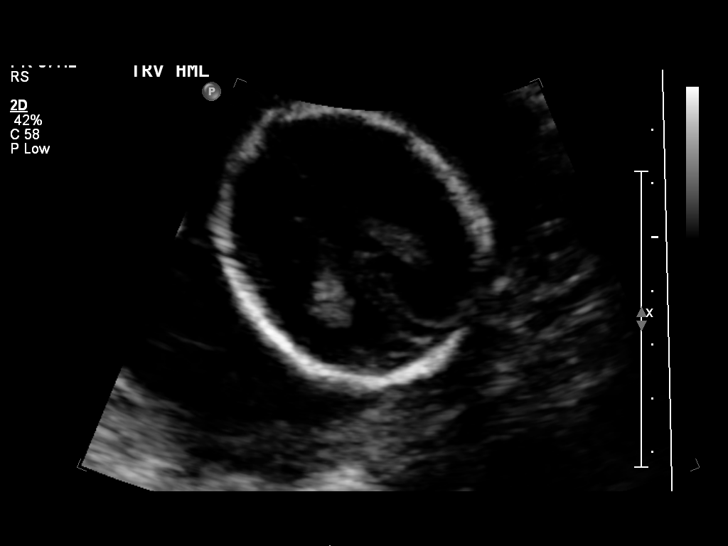
[im 33/69]
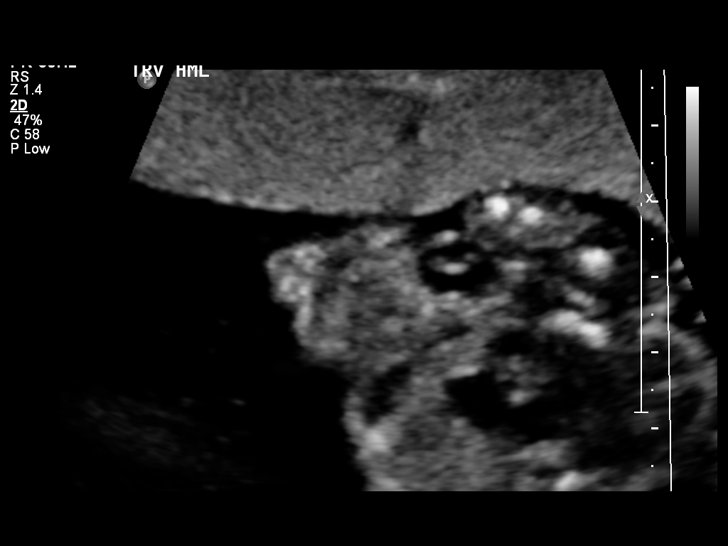
[im 39/69]
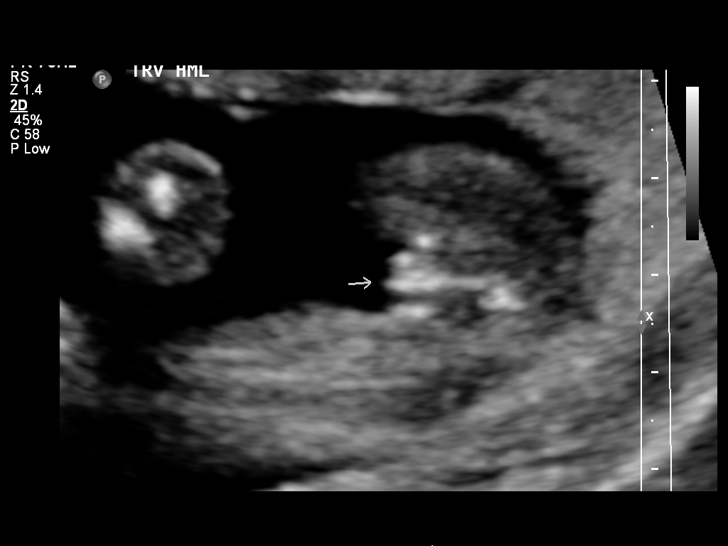
[im 49/69]
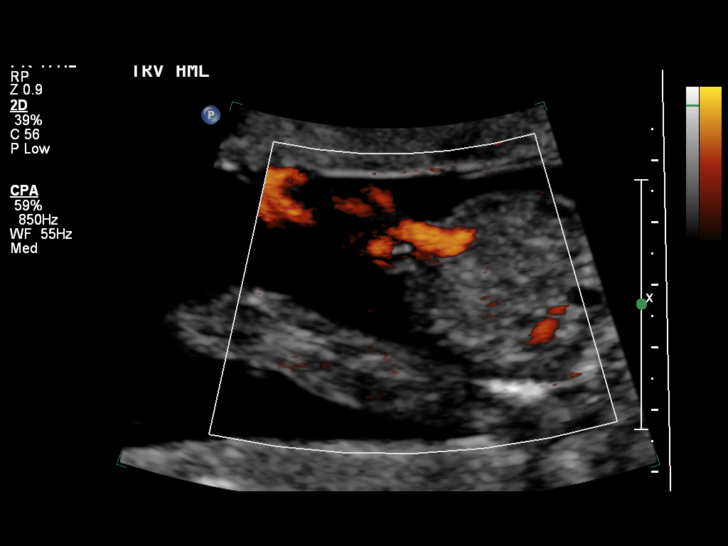
[im 56/69]
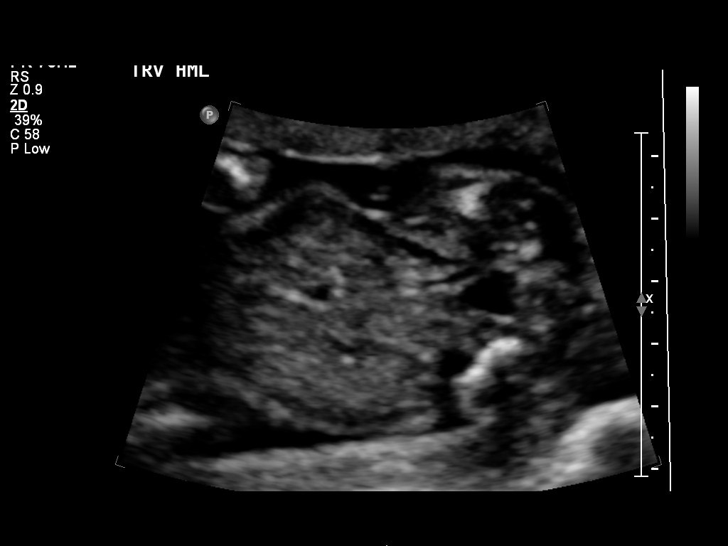
[im 62/69]
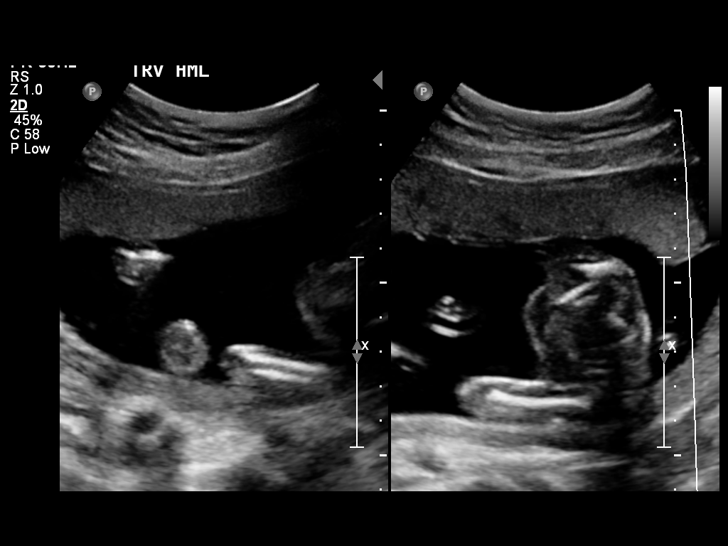

[Series 1: us ob comp +14 wk mfm · 3 of 20 slices shown (2 of 2)]
[im 1/20]
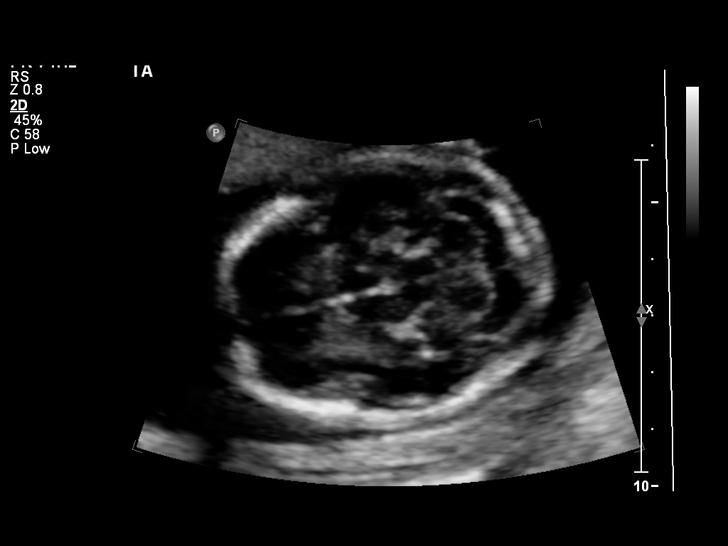
[im 8/20]
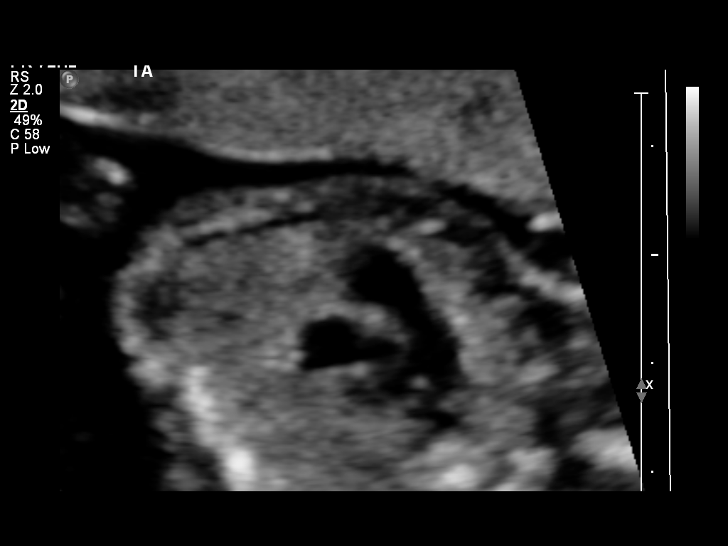
[im 16/20]
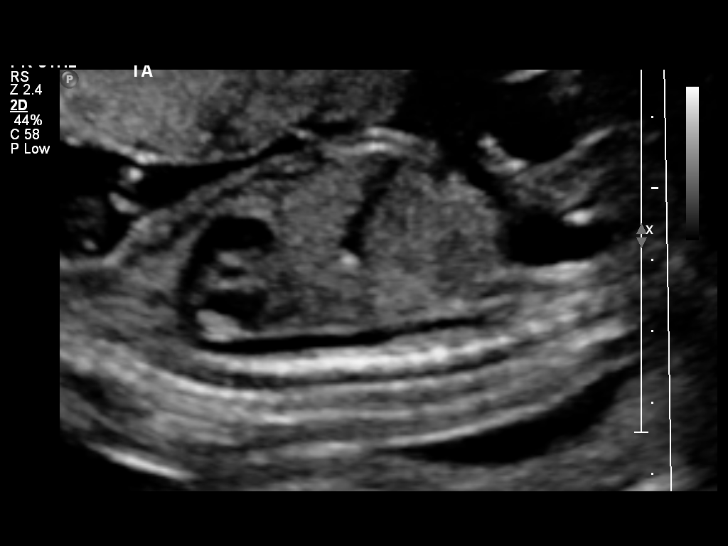

[12 of 28 positions shown; findings below may reference images not displayed]

OBSTETRICS REPORT
                      (Signed Final 08/30/2014 [DATE])

                                                         [REDACTED]-
                                                         Faculty Physician
Service(s) Provided

 US OB COMP + 14 WK                                    76805.1
Indications

 Basic anatomic survey                                 z36
 19 weeks gestation of pregnancy
Fetal Evaluation

 Num Of Fetuses:    1
 Fetal Heart Rate:  155                          bpm
 Cardiac Activity:  Observed
 Presentation:      Transverse, head to
                    maternal left
 Placenta:          Anterior, above cervical os
 P. Cord            Visualized, central
 Insertion:

 Amniotic Fluid
 AFI FV:      Subjectively within normal limits
                                             Larg Pckt:     5.3  cm
Biometry

 BPD:     46.3  mm     G. Age:  20w 0d                CI:        77.57   70 - 86
                                                      FL/HC:      18.0   16.8 -

 HC:     166.4  mm     G. Age:  19w 2d       25  %    HC/AC:      1.13   1.09 -

 AC:     147.9  mm     G. Age:  20w 1d       56  %    FL/BPD:
 FL:        30  mm     G. Age:  19w 2d       27  %    FL/AC:      20.3   20 - 24
 HUM:     30.4  mm     G. Age:  20w 0d       61  %

 Est. FW:     306  gm    0 lb 11 oz      48  %
Gestational Age

 LMP:           21w 1d        Date:  04/04/14                 EDD:   01/09/15
 U/S Today:     19w 5d                                        EDD:   01/19/15
 Best:          19w 5d     Det. By:  U/S (08/30/14)           EDD:   01/19/15
Anatomy
 Cranium:          Appears normal         Aortic Arch:      Appears normal
 Fetal Cavum:      Appears normal         Ductal Arch:      Appears normal
 Ventricles:       Appears normal         Diaphragm:        Appears normal
 Choroid Plexus:   Appears normal         Stomach:          Appears normal, left
                                                            sided
 Cerebellum:       Appears normal         Abdomen:          Appears normal
 Posterior Fossa:  Appears normal         Abdominal Wall:   Appears nml (cord
                                                            insert, abd wall)
 Nuchal Fold:      Appears normal         Cord Vessels:     Appears normal (3
                                                            vessel cord)
 Face:             Appears normal         Kidneys:          Appear normal
                   (orbits and profile)
 Lips:             Appears normal         Bladder:          Appears normal
 Heart:            Appears normal         Spine:            Not well visualized
                   (4CH, axis, and
                   situs)
 RVOT:             Appears normal         Lower             Appears normal
                                          Extremities:
 LVOT:             Appears normal         Upper             Appears normal
                                          Extremities:

 Other:  Fetus appears to be a female. Heels and 5th digit visualized. Nasal
         bone visualized. Technically difficult due to fetal position.
Cervix Uterus Adnexa

 Cervical Length:    2.9      cm

 Cervix:       Normal appearance by transabdominal scan.
Comments

 A 9 day disrepancy is noted between the patient's "sure LMP"
 and ultrasound today.  Recommend adjusting EDD based on
 today's study.
Impression

 Single IUP at 19w 5d
 Limited views of the fetal spine were obtained
 The remainder of the fetal anatomy appears normal
 No markers associated with aneuploidy were appreciated
 Anterior placenta without previa
 Normal amniotic fluid volume
Recommendations

 Recommend follow-up ultrasound examination in 4 weeks for
 growth and to reevaluate the fetal spine.

 questions or concerns.

## 2019-08-01 ENCOUNTER — Ambulatory Visit: Payer: BC Managed Care – PPO | Attending: Internal Medicine

## 2019-08-01 DIAGNOSIS — Z20822 Contact with and (suspected) exposure to covid-19: Secondary | ICD-10-CM

## 2019-08-02 LAB — NOVEL CORONAVIRUS, NAA: SARS-CoV-2, NAA: NOT DETECTED

## 2019-10-25 ENCOUNTER — Inpatient Hospital Stay (HOSPITAL_COMMUNITY)
Admission: AD | Admit: 2019-10-25 | Discharge: 2019-10-26 | Disposition: A | Discharge status: Home / Self Care | Payer: Self-pay | Attending: Obstetrics & Gynecology | Admitting: Obstetrics & Gynecology

## 2019-10-25 ENCOUNTER — Encounter (HOSPITAL_COMMUNITY): Payer: Self-pay | Admitting: *Deleted

## 2019-10-25 ENCOUNTER — Inpatient Hospital Stay (HOSPITAL_COMMUNITY): Payer: Self-pay

## 2019-10-25 DIAGNOSIS — Z3A01 Less than 8 weeks gestation of pregnancy: Secondary | ICD-10-CM

## 2019-10-25 DIAGNOSIS — R109 Unspecified abdominal pain: Secondary | ICD-10-CM

## 2019-10-25 DIAGNOSIS — O26899 Other specified pregnancy related conditions, unspecified trimester: Secondary | ICD-10-CM

## 2019-10-25 DIAGNOSIS — O00202 Left ovarian pregnancy without intrauterine pregnancy: Secondary | ICD-10-CM

## 2019-10-25 DIAGNOSIS — O00102 Left tubal pregnancy without intrauterine pregnancy: Secondary | ICD-10-CM | POA: Insufficient documentation

## 2019-10-25 LAB — COMPREHENSIVE METABOLIC PANEL
ALT: 18 U/L (ref 0–44)
AST: 18 U/L (ref 15–41)
Albumin: 3.9 g/dL (ref 3.5–5.0)
Alkaline Phosphatase: 36 U/L — ABNORMAL LOW (ref 38–126)
Anion gap: 11 (ref 5–15)
BUN: 7 mg/dL (ref 6–20)
CO2: 23 mmol/L (ref 22–32)
Calcium: 9.1 mg/dL (ref 8.9–10.3)
Chloride: 102 mmol/L (ref 98–111)
Creatinine, Ser: 0.56 mg/dL (ref 0.44–1.00)
GFR calc Af Amer: >60 mL/min (ref >60)
GFR calc non Af Amer: >60 mL/min (ref >60)
Glucose, Bld: 86 mg/dL (ref 70–99)
Potassium: 3.6 mmol/L (ref 3.5–5.1)
Sodium: 136 mmol/L (ref 135–145)
Total Bilirubin: 0.5 mg/dL (ref 0.3–1.2)
Total Protein: 7.8 g/dL (ref 6.5–8.1)

## 2019-10-25 LAB — URINALYSIS, ROUTINE W REFLEX MICROSCOPIC
Bilirubin Urine: NEGATIVE
Glucose, UA: NEGATIVE mg/dL
Ketones, ur: 20 mg/dL — AB
Leukocytes,Ua: NEGATIVE
Nitrite: NEGATIVE
Protein, ur: NEGATIVE mg/dL
Specific Gravity, Urine: 1.011 (ref 1.005–1.030)
pH: 5 (ref 5.0–8.0)

## 2019-10-25 LAB — WET PREP, GENITAL
Clue Cells Wet Prep HPF POC: NONE SEEN
Sperm: NONE SEEN
Trich, Wet Prep: NONE SEEN
Yeast Wet Prep HPF POC: NONE SEEN

## 2019-10-25 LAB — POCT PREGNANCY, URINE: Preg Test, Ur: POSITIVE — AB

## 2019-10-25 LAB — CBC
HCT: 39.5 % (ref 36.0–46.0)
Hemoglobin: 12.8 g/dL (ref 12.0–15.0)
MCH: 24.1 pg — ABNORMAL LOW (ref 26.0–34.0)
MCHC: 32.4 g/dL (ref 30.0–36.0)
MCV: 74.4 fL — ABNORMAL LOW (ref 80.0–100.0)
Platelets: 343 10*3/uL (ref 150–400)
RBC: 5.31 MIL/uL — ABNORMAL HIGH (ref 3.87–5.11)
RDW: 12.7 % (ref 11.5–15.5)
WBC: 11.7 10*3/uL — ABNORMAL HIGH (ref 4.0–10.5)
nRBC: 0 % (ref 0.0–0.2)

## 2019-10-25 LAB — HCG, QUANTITATIVE, PREGNANCY: hCG, Beta Chain, Quant, S: 4809 m[IU]/mL — ABNORMAL HIGH (ref <5)

## 2019-10-25 MED ORDER — METHOTREXATE FOR ECTOPIC PREGNANCY
50.0000 mg/m2 | Freq: Once | INTRAMUSCULAR | Status: AC
  Administered 2019-10-25: 75 mg via INTRAMUSCULAR
  Filled 2019-10-25: qty 1

## 2019-10-25 NOTE — ED Provider Notes (Signed)
Patient placed in Quick Look pathway, seen and evaluated   Chief Complaint: Vaginal bleeding  HPI: Last menstrual period September 03, 2019, has had 2+ home pregnancy tests in the past 2 weeks.  This morning she noticed small amount of vaginal bleeding which has continued, no associated pain.  Reports that she is feeling well otherwise.   ROS:  Positive for vaginal bleeding and mild lower abdominal cramping. Denies fever/chills, chest pain/shortness of breath, recent illness, or any additional concerns  Physical Exam:   Gen: No distress  Neuro: Awake and Alert  Skin: Warm    Focused Exam: Patient well-appearing no acute distress, pleasant.  Abdomen soft nontender without peritoneal signs.  Concern for possible miscarriage, as patient is without significant abdominal pain doubt ectopic, appendicitis, diverticulitis, torsion or other emergent pathologies at this time.  She appears stable for transfer to [facility]. - Discussed case with Aurther Loft, NP at [facility] who accepts patient in transfer.   Initiation of care has begun. The patient has been counseled on the process, plan, and necessity for staying for the completion/evaluation, and the remainder of the medical screening examination   Michele Larson 10/25/19 1749    Vanetta Mulders, MD 11/07/19 [phone]

## 2019-10-25 NOTE — Discharge Instructions (Signed)
Ectopic Pregnancy ° °An ectopic pregnancy is when the fertilized egg attaches (implants) outside the uterus. Most ectopic pregnancies occur in one of the tubes where eggs travel from the ovary to the uterus (fallopian tubes), but the implanting can occur in other locations. In rare cases, ectopic pregnancies occur on the ovary, intestine, pelvis, abdomen, or cervix. In an ectopic pregnancy, the fertilized egg does not have the ability to develop into a normal, healthy baby. °A ruptured ectopic pregnancy is one in which tearing or bursting of a fallopian tube causes internal bleeding. Often, there is intense lower abdominal pain, and vaginal bleeding sometimes occurs. Having an ectopic pregnancy can be life-threatening. If this dangerous condition is not treated, it can lead to blood loss, shock, or even death. °What are the causes? °The most common cause of this condition is damage to one of the fallopian tubes. A fallopian tube may be narrowed or blocked, and that keeps the fertilized egg from reaching the uterus. °What increases the risk? °This condition is more likely to develop in women of childbearing age who have different levels of risk. The levels of risk can be divided into three categories. °High risk °· You have gone through infertility treatment. °· You have had an ectopic pregnancy before. °· You have had surgery on the fallopian tubes, or another surgical procedure, such as an abortion. °· You have had surgery to have the fallopian tubes tied (tubal ligation). °· You have problems or diseases of the fallopian tubes. °· You have been exposed to diethylstilbestrol (DES). This medicine was used until 1971, and it had effects on babies whose mothers took the medicine. °· You become pregnant while using an IUD (intrauterine device) for birth control. °Moderate risk °· You have a history of infertility. °· You have had an STI (sexually transmitted infection). °· You have a history of pelvic inflammatory  disease (PID). °· You have scarring from endometriosis. °· You have multiple sexual partners. °· You smoke. °Low risk °· You have had pelvic surgery. °· You use vaginal douches. °· You became sexually active before age 18. °What are the signs or symptoms? °Common symptoms of this condition include normal pregnancy symptoms, such as missing a period, nausea, tiredness, abdominal pain, breast tenderness, and bleeding. However, ectopic pregnancy will have additional symptoms, such as: °· Pain with intercourse. °· Irregular vaginal bleeding or spotting. °· Cramping or pain on one side or in the lower abdomen. °· Fast heartbeat, low blood pressure, and sweating. °· Passing out while having a bowel movement. °Symptoms of a ruptured ectopic pregnancy and internal bleeding may include: °· Sudden, severe pain in the abdomen and pelvis. °· Dizziness, weakness, light-headedness, or fainting. °· Pain in the shoulder or neck area. °How is this diagnosed? °This condition is diagnosed by: °· A pelvic exam to locate pain or a mass in the abdomen. °· A pregnancy test. This blood test checks for the presence as well as the specific level of pregnancy hormone in the bloodstream. °· Ultrasound. This is performed if a pregnancy test is positive. In this test, a probe is inserted into the vagina. The probe will detect a fetus, possibly in a location other than the uterus. °· Taking a sample of uterus tissue (dilation and curettage, or D&C). °· Surgery to perform a visual exam of the inside of the abdomen using a thin, lighted tube that has a tiny camera on the end (laparoscope). °· Culdocentesis. This procedure involves inserting a needle at the top of   the vagina, behind the uterus. If blood is present in this area, it may indicate that a fallopian tube is torn. How is this treated? This condition is treated with medicine or surgery. Medicine  An injection of a medicine (methotrexate) may be given to cause the pregnancy tissue to be  absorbed. This medicine may save your fallopian tube. It may be given if: ? The diagnosis is made early, with no signs of active bleeding. ? The fallopian tube has not ruptured. ? You are considered to be a good candidate for the medicine. Usually, pregnancy hormone blood levels are checked after methotrexate treatment. This is to be sure that the medicine is effective. It may take 4-6 weeks for the pregnancy to be absorbed. Most pregnancies will be absorbed by 3 weeks. Surgery  A laparoscope may be used to remove the pregnancy tissue.  If severe internal bleeding occurs, a larger cut (incision) may be made in the lower abdomen (laparotomy) to remove the fetus and placenta. This is done to stop the bleeding.  Part or all of the fallopian tube may be removed (salpingectomy) along with the fetus and placenta. The fallopian tube may also be repaired during the surgery.  In very rare circumstances, removal of the uterus (hysterectomy) may be required.  After surgery, pregnancy hormone testing may be done to be sure that there is no pregnancy tissue left. Whether your treatment is medicine or surgery, you may receive a Rho (D) immune globulin shot to prevent problems with any future pregnancy. This shot may be given if:  You are Rh-negative and the baby's father is Rh-positive.  You are Rh-negative and you do not know the Rh type of the baby's father. Follow these instructions at home:  Rest and limit your activity after the procedure for as long as told by your health care provider.  Until your health care provider says that it is safe: ? Do not lift anything that is heavier than 10 lb (4.5 kg), or the limit that your health care provider tells you. ? Avoid physical exercise and any movement that requires effort (is strenuous).  To help prevent constipation: ? Eat a healthy diet that includes fruits, vegetables, and whole grains. ? Drink 6-8 glasses of water per day. Get help right away  if:  You develop worsening pain that is not relieved by medicine.  You have: ? A fever or chills. ? Vaginal bleeding. ? Redness and swelling at the incision site. ? Nausea and vomiting.  You feel dizzy or weak.  You feel light-headed or you faint. This information is not intended to replace advice given to you by your health care provider. Make sure you discuss any questions you have with your health care provider. Document Revised: 07/01/2017 Document Reviewed: 02/18/2016 Elsevier Patient Education  [place]. Methotrexate Treatment for an Ectopic Pregnancy, Care After This sheet gives you information about how to care for yourself after your procedure. Your health care provider may also give you more specific instructions. If you have problems or questions, contact your health care provider. What can I expect after the procedure? After the procedure, it is common to have:  Abdominal cramping.  Vaginal bleeding.  Fatigue.  Nausea.  Vomiting.  Diarrhea. Blood tests will be taken at timed intervals for several days or weeks to check your pregnancy hormone levels. The blood tests will be done until the pregnancy hormone can no longer be detected in the blood. Follow these instructions at home: Activity  Do not have sex until your health care provider approves.  Limit activities that take a lot of effort as told by your health care provider. Medicines  Take over the counter and prescription medicines only as told by your health care provider.  Do not take aspirin, ibuprofen, naproxen, or any other NSAIDs.  Do not take folic acid, prenatal vitamins, or other vitamins that contain folic acid. General instructions   Do not drink alcohol.  Follow instructions from your health care provider on how and when to report any symptoms that may indicate a ruptured ectopic pregnancy.  Keep all follow-up visits as told by your health care provider. This is  important. Contact a health care provider if:  You have persistent nausea and vomiting.  You have persistent diarrhea.  You are having a reaction to the medicine, such as: ? Tiredness. ? Skin rash. ? Hair loss. Get help right away if:  Your abdominal or pelvic pain gets worse.  You have more vaginal bleeding.  You feel light-headed or you faint.  You have shortness of breath.  Your heart rate increases.  You develop a cough.  You have chills.  You have a fever. Summary  After the procedure, it is common to have symptoms of abdominal cramping, vaginal bleeding and fatigue. You may also experience other symptoms.  Blood tests will be taken at timed intervals for several days or weeks to check your pregnancy hormone levels. The blood tests will be done until the pregnancy hormone can no longer be detected in the blood.  Limit strenuous activity as told by your health care provider.  Follow instructions from your health care provider on how and when to report any symptoms that may indicate a ruptured ectopic pregnancy. This information is not intended to replace advice given to you by your health care provider. Make sure you discuss any questions you have with your health care provider. Document Revised: 07/01/2017 Document Reviewed: 09/07/2016 Elsevier Patient Education  2020 [employer].

## 2019-10-25 NOTE — MAU Provider Note (Addendum)
History     CSN: [id number]  Arrival date and time: 10/25/19 1653   First Provider Initiated Contact with Patient 10/25/19 1851      Chief Complaint  Patient presents with  . Possible Pregnancy   Ms. Michele Larson is a 29 y.o. G2P1001 at [redacted]w[redacted]d who presents to MAU for vaginal bleeding which began about 3 hours ago. Patient reports she went to use the restroom at work and found she was bleeding. Patient reports she saw blood while wiping and some blood in the toilet and has not bled since that time.  Passing blood clots? no Blood soaking clothes? no Lightheaded/dizzy? no Significant pelvic pain or cramping? Mild cramping that started around the same time as the bleeding and is present at this time Passed any tissue? no  Current pregnancy problems? Pt has not yet been seen Blood Type? B Positive Allergies? NKDA Current medications? none Current PNC & next appt? None, pt requests list of OB providers  Pt denies vaginal discharge/odor/itching. Pt denies N/V, abdominal pain, constipation, diarrhea, or urinary problems. Pt denies fever, chills, fatigue, sweating or changes in appetite. Pt denies SOB or chest pain. Pt denies dizziness, HA, light-headedness, weakness.   OB History    Gravida  2   Para  1   Term  1   Preterm      AB      Living  1     SAB      TAB      Ectopic      Multiple  0   Live Births  1           Past Medical History:  Diagnosis Date  . Headache   . Hepatitis B affecting pregnancy     Past Surgical History:  Procedure Laterality Date  . NO PAST SURGERIES      History reviewed. No pertinent family history.  Social History   Tobacco Use  . Smoking status: Never Smoker  . Smokeless tobacco: Never Used  Substance Use Topics  . Alcohol use: No  . Drug use: No    Allergies: No Known Allergies  Medications Prior to Admission  Medication Sig Dispense Refill Last Dose  . Prenatal Vit-Fe Fumarate-FA (PRENATAL MULTIVITAMIN)  TABS tablet Take 1 tablet by mouth daily at 12 noon.   10/25/2019 at Unknown time  . ibuprofen (ADVIL,MOTRIN) 600 MG tablet Take 1 tablet (600 mg total) by mouth every 6 (six) hours. 30 tablet 0     Review of Systems  Constitutional: Negative for chills, diaphoresis, fatigue and fever.  Eyes: Negative for visual disturbance.  Respiratory: Negative for shortness of breath.   Cardiovascular: Negative for chest pain.  Gastrointestinal: Negative for abdominal pain, constipation, diarrhea, nausea and vomiting.  Genitourinary: Positive for pelvic pain and vaginal bleeding. Negative for dysuria, flank pain, frequency, urgency and vaginal discharge.  Neurological: Negative for dizziness, weakness, light-headedness and headaches.   Physical Exam   Blood pressure 129/85, pulse 81, temperature 98.1 F (36.7 C), resp. rate 16, height 4\' 9"  (1.448 m), weight 56.2 kg, last menstrual period 09/03/2019, SpO2 99 %, unknown if currently breastfeeding.  Patient Vitals for the past 24 hrs:  BP Temp Temp src Pulse Resp SpO2 Height Weight  10/25/19 1817 129/85 98.1 F (36.7 C) -- 81 16 99 % 4\' 9"  (1.448 m) 56.2 kg  10/25/19 1657 123/71 98.1 F (36.7 C) Oral 86 16 99 % 5' (1.524 m) 56.7 kg   Physical Exam  Constitutional: She  is oriented to person, place, and time. She appears well-developed and well-nourished. No distress.  HENT:  Head: Normocephalic and atraumatic.  Respiratory: Effort normal.  GI: Soft. She exhibits no distension and no mass. There is no abdominal tenderness. There is no rebound and no guarding.  Neurological: She is alert and oriented to person, place, and time.  Skin: Skin is warm and dry. She is not diaphoretic.  Psychiatric: She has a normal mood and affect. Her behavior is normal. Judgment and thought content normal.   Results for orders placed or performed during the hospital encounter of 10/25/19 (from the past 24 hour(s))  Pregnancy, urine POC     Status: Abnormal    Collection Time: 10/25/19  6:13 PM  Result Value Ref Range   Preg Test, Ur POSITIVE (A) NEGATIVE  Urinalysis, Routine w reflex microscopic     Status: Abnormal   Collection Time: 10/25/19  6:22 PM  Result Value Ref Range   Color, Urine STRAW (A) YELLOW   APPearance CLEAR CLEAR   Specific Gravity, Urine 1.011 1.005 - 1.030   pH 5.0 5.0 - 8.0   Glucose, UA NEGATIVE NEGATIVE mg/dL   Hgb urine dipstick MODERATE (A) NEGATIVE   Bilirubin Urine NEGATIVE NEGATIVE   Ketones, ur 20 (A) NEGATIVE mg/dL   Protein, ur NEGATIVE NEGATIVE mg/dL   Nitrite NEGATIVE NEGATIVE   Leukocytes,Ua NEGATIVE NEGATIVE   RBC / HPF 6-10 0 - 5 RBC/hpf   WBC, UA 0-5 0 - 5 WBC/hpf   Bacteria, UA RARE (A) NONE SEEN   Squamous Epithelial / LPF 0-5 0 - 5  CBC     Status: Abnormal   Collection Time: 10/25/19  6:51 PM  Result Value Ref Range   WBC 11.7 (Michele) 4.0 - 10.5 K/uL   RBC 5.31 (Michele) 3.87 - 5.11 MIL/uL   Hemoglobin 12.8 12.0 - 15.0 g/dL   HCT 09.3 26.7 - 12.4 %   MCV 74.4 (L) 80.0 - 100.0 fL   MCH 24.1 (L) 26.0 - 34.0 pg   MCHC 32.4 30.0 - 36.0 g/dL   RDW 58.0 99.8 - 33.8 %   Platelets 343 150 - 400 K/uL   nRBC 0.0 0.0 - 0.2 %  Comprehensive metabolic panel     Status: Abnormal   Collection Time: 10/25/19  6:51 PM  Result Value Ref Range   Sodium 136 135 - 145 mmol/L   Potassium 3.6 3.5 - 5.1 mmol/L   Chloride 102 98 - 111 mmol/L   CO2 23 22 - 32 mmol/L   Glucose, Bld 86 70 - 99 mg/dL   BUN 7 6 - 20 mg/dL   Creatinine, Ser 2.50 0.44 - 1.00 mg/dL   Calcium 9.1 8.9 - 53.9 mg/dL   Total Protein 7.8 6.5 - 8.1 g/dL   Albumin 3.9 3.5 - 5.0 g/dL   AST 18 15 - 41 U/L   ALT 18 0 - 44 U/L   Alkaline Phosphatase 36 (L) 38 - 126 U/L   Total Bilirubin 0.5 0.3 - 1.2 mg/dL   GFR calc non Af Amer >60 >60 mL/min   GFR calc Af Amer >60 >60 mL/min   Anion gap 11 5 - 15  hCG, quantitative, pregnancy     Status: Abnormal   Collection Time: 10/25/19  6:51 PM  Result Value Ref Range   hCG, Beta Chain, Quant, S 4,809  (Michele) <5 mIU/mL  Wet prep, genital     Status: Abnormal   Collection Time: 10/25/19  7:01  PM   Specimen: PATH Cytology Cervicovaginal Ancillary Only  Result Value Ref Range   Yeast Wet Prep HPF POC NONE SEEN NONE SEEN   Trich, Wet Prep NONE SEEN NONE SEEN   Clue Cells Wet Prep HPF POC NONE SEEN NONE SEEN   WBC, Wet Prep HPF POC MODERATE (A) NONE SEEN   Sperm NONE SEEN    No results found.  MAU Course  Procedures  MDM -r/o ectopic -UA: straw/mod hgb/20ketones/rare bacteria, sending urine for culture -CBC: no abnormalities requiring treatment -CMP: WNL -[place]: pending at time of care transfer -hCG: 4,809 -ABO: B Positive -WetPrep: WNL -GC/CT collected -care transferred to Wynelle Bourgeois, CNM @1000PM   Orders Placed This Encounter  Procedures  . Wet prep, genital    Standing Status:   Standing    Number of Occurrences:   1  . Culture, OB Urine    Standing Status:   Standing    Number of Occurrences:   1  . OB LESS THAN 14 WEEKS WITH OB TRANSVAGINAL    Standing Status:   Standing    Number of Occurrences:   1    Order Specific Question:   Symptom/Reason for Exam    Answer:   Abdominal pain in pregnancy [place]  . Urinalysis, Routine w reflex microscopic    Standing Status:   Standing    Number of Occurrences:   1  . CBC    Standing Status:   Standing    Number of Occurrences:   1  . Comprehensive metabolic panel    Standing Status:   Standing    Number of Occurrences:   1  . hCG, quantitative, pregnancy    Standing Status:   Standing    Number of Occurrences:   1  . Pregnancy, urine POC    Standing Status:   Standing    Number of Occurrences:   1   No orders of the defined types were placed in this encounter. Assumed care while patient was in [[id number]]  US OB LESS THAN 14 WEEKS WITH OB TRANSVAGINAL  Result Date: 10/25/2019 CLINICAL DATA:  29 year old pregnant female with abdominal pain. LMP: 09/03/2019 corresponding to an estimated gestational age of [redacted] weeks, 3 days.  EXAM: OBSTETRIC <14 WK 11/01/2019 AND TRANSVAGINAL OB US TECHNIQUE: Both transabdominal and transvaginal ultrasound examinations were performed for complete evaluation of the gestation as well as the maternal uterus, adnexal regions, and pelvic cul-de-sac. Transvaginal technique was performed to assess early pregnancy. COMPARISON:  None. FINDINGS: The uterus is anteverted appears unremarkable. The endometrium measures approximately 1 cm in thickness and appears unremarkable. No intrauterine pregnancy identified. The right ovary measures 3.5 x 2.5 x 3.1 cm. There is a 2 cm cyst in the right ovary. The left ovary is unremarkable and measures 3.7 x 1.8 x 1.5 cm There is a 1.4 x 1.1 x 1.2 cm complex predominantly solid lesion in the region of the left adnexa superior to the left ovary concerning for an ectopic pregnancy. A small cystic structure within this mass may represent a yolk sac. No fetal pole identified. There is a small amount of simple appearing free fluid within the pelvis. IMPRESSION: 1. No intrauterine pregnancy identified. 2. Rounded solid lesion superior to the left ovary most concerning for an ectopic pregnancy. Clinical correlation and obstetrical consult is advised. These results were called by telephone at the time of interpretation on 10/25/2019 at 10:14 pm to nurse midwife 10/27/2019, who verbally acknowledged these results. Electronically Signed  By: Anner Crete M.D.   On: 10/25/2019 22:18   Consulted Dr Hulan Fray with US findings She recommends Methotrexate therapy This was given and patient tolerated well  B+  Assessment and Plan  A:  Left Ectopic Pregnancy at [redacted]w[redacted]d      Post Methotrexate therapy for Ectopic pregnancy        P:   Discharge home        Return on Sunday for Day#4 HCG level        Patient is given instructions to call our office any time or to go to nearest ER if develops any acute abdominal pain, or if bleeding worsens or soaking through 1 pad/tampon per hour for two hours or  if becomes symptomatic with sob, chest pain, fatigue, lightheaded, or weakness, or if develops fevers, chills or nausea and vomiting.  Patient is instructed to rest, no exercise, pelvic rest and can start on pre natal vitamin.    Patient verbalized understanding of all instructions and emergency symptoms.         Strict ectopic precautions  Seabron Spates, CNM

## 2019-10-25 NOTE — MAU Note (Signed)
.   Michele Larson is a 29 y.o. at [redacted]w[redacted]d here in MAU reporting: lower abdominal cramping with vaginal bleeding that started 3 hours ago. Last intercourse last night LMP: 09/03/19 Onset of complaint: 3 hours Pain score: 8/10 Vitals:   10/25/19 1657 10/25/19 1817  BP: 123/71 129/85  Pulse: 86 81  Resp: 16 16  Temp: 98.1 F (36.7 C) 98.1 F (36.7 C)  SpO2: 99% 99%     FHT: Lab orders placed from triage: UA/UPT

## 2019-10-25 NOTE — ED Triage Notes (Addendum)
Pt arrives to ED with concern for miscarriage and abd pain. Pt states her LMP was 09/03/19 and has taken 2 + pregnancy  test at home.  Pt is G2P1. Pt began to have vaginal bleeding this morning. Pt states currently is just using a napkin because did not have a pad but describes the bleeding as mild but this morning had a "gush".

## 2019-10-26 LAB — CULTURE, OB URINE: Culture: NO GROWTH

## 2019-10-28 ENCOUNTER — Other Ambulatory Visit: Payer: Self-pay

## 2019-10-28 ENCOUNTER — Telehealth: Payer: Self-pay | Admitting: Student

## 2019-10-28 ENCOUNTER — Inpatient Hospital Stay (HOSPITAL_COMMUNITY)
Admission: AD | Admit: 2019-10-28 | Discharge: 2019-10-28 | Disposition: A | Discharge status: Home / Self Care | Payer: Self-pay | Attending: Obstetrics & Gynecology | Admitting: Obstetrics & Gynecology

## 2019-10-28 DIAGNOSIS — O09521 Supervision of elderly multigravida, first trimester: Secondary | ICD-10-CM | POA: Insufficient documentation

## 2019-10-28 DIAGNOSIS — Z79899 Other long term (current) drug therapy: Secondary | ICD-10-CM

## 2019-10-28 DIAGNOSIS — O009 Unspecified ectopic pregnancy without intrauterine pregnancy: Secondary | ICD-10-CM | POA: Diagnosis present

## 2019-10-28 DIAGNOSIS — Z3A01 Less than 8 weeks gestation of pregnancy: Secondary | ICD-10-CM | POA: Insufficient documentation

## 2019-10-28 DIAGNOSIS — Z5181 Encounter for therapeutic drug level monitoring: Secondary | ICD-10-CM

## 2019-10-28 LAB — HCG, QUANTITATIVE, PREGNANCY: hCG, Beta Chain, Quant, S: 7378 m[IU]/mL — ABNORMAL HIGH (ref <5)

## 2019-10-28 NOTE — Discharge Instructions (Signed)
-  keep appt on Wednesday for folllow up blood work.  -Return to MAU if any worsening of symptoms -

## 2019-10-28 NOTE — MAU Note (Signed)
Pt discharged and CNM will call with results. Pt to remain in MAU census until labs are back and CNM is able to call pt.

## 2019-10-28 NOTE — MAU Provider Note (Signed)
Patient [name] is a 29 y.o. G2P1001  at [redacted]w[redacted]d here for follow up from methotrexate administration on 10/25/19. She says her pain and bleeding are less now. No other complaints.  Patient will have bHCG level drawn and then would like to return home; she knows that we will call her when results are ready.  Confirmed patient's phone number; will call her with results.  Appt made for Wednesday, 3-31 for follow up day # 7 labs and work note given.   [name] 10/28/2019, 10:04 AM

## 2019-10-28 NOTE — MAU Note (Signed)
Michele Larson is a 29 y.o. at [redacted]w[redacted]d here in MAU reporting: here for day 4 labs post MTX. Denies pain and bleeding.   Pain score: 0/10  Vitals:   10/28/19 0957  BP: 119/65  Pulse: 91  Resp: 16  Temp: 98.2 F (36.8 C)  SpO2: 97%     Lab orders placed from triage: hcg

## 2019-10-28 NOTE — Telephone Encounter (Signed)
Called patient and informed her that her pregnancy hormone level had increased, and that this was a normal finding. Reassured her that we expect to see a large drop between today (Day # 4) and Day #7 (Wednesday). She plans to keep her appt on Wednesday for bHCG. Reviewed strict return precautions to both patient and husband. All questions answered.  Michele Larson

## 2019-10-29 LAB — GC/CHLAMYDIA PROBE AMP (~~LOC~~) NOT AT ARMC
Chlamydia: NEGATIVE
Comment: NEGATIVE
Comment: NORMAL
Neisseria Gonorrhea: NEGATIVE

## 2019-10-31 ENCOUNTER — Other Ambulatory Visit: Payer: Self-pay

## 2019-10-31 ENCOUNTER — Ambulatory Visit (INDEPENDENT_AMBULATORY_CARE_PROVIDER_SITE_OTHER): Payer: Self-pay

## 2019-10-31 DIAGNOSIS — Z79899 Other long term (current) drug therapy: Secondary | ICD-10-CM

## 2019-10-31 DIAGNOSIS — Z5181 Encounter for therapeutic drug level monitoring: Secondary | ICD-10-CM

## 2019-10-31 DIAGNOSIS — O00202 Left ovarian pregnancy without intrauterine pregnancy: Secondary | ICD-10-CM

## 2019-10-31 LAB — BETA HCG QUANT (REF LAB): hCG Quant: 6641 m[IU]/mL

## 2019-10-31 NOTE — Progress Notes (Signed)
Patient seen and assessed by nursing staff.  Agree with documentation and plan.  

## 2019-10-31 NOTE — Progress Notes (Signed)
Pt here today for STAT Beta Lab s/p day 7 MTX tx from ectopic pregnancy.  With Interpreter Coral Else., pt denies any vaginal bleeding or pain.  Pt states that she had one episode of N/V and she felt hot like she had a temp but did not check her temp.  Pt states that she does not feel that way now. I advised pt that she may have felt flushed due to N/V however if she feels that way again to please check to verify if she has a fever especially with the times of COVID.  I explained to the pt that I will call her with results and f/u in approximately two hours.  Pt verbalized understanding.   Received beta results from [facility] at 1019 with result of [id number].  Notified Dr. Shawnie Pons who recommends that beta levels have decreased appropriately and pt needs to come weekly for non stat beta to follow to zero.  With [facility] Interpreter # [phone] pt informed provider's recommendation and if she could please come in on next Wednesday for a non stat beta.  I explained to the pt that today was her beta level day 7 of MTX and not day 4 from her MTX injection on 10/25/19.  Pt verbalized understanding with no further questions.  Pt call transferred with interpreter on the line to the front office to schedule her lab only appt for non stat beta.    Addison Naegeli, RN 10/31/19

## 2019-11-07 ENCOUNTER — Other Ambulatory Visit: Payer: Self-pay

## 2019-11-07 DIAGNOSIS — O00202 Left ovarian pregnancy without intrauterine pregnancy: Secondary | ICD-10-CM

## 2019-11-08 LAB — BETA HCG QUANT (REF LAB): hCG Quant: 3905 m[IU]/mL

## 2020-02-20 ENCOUNTER — Ambulatory Visit (HOSPITAL_COMMUNITY)
Admission: EM | Admit: 2020-02-20 | Discharge: 2020-02-20 | Disposition: A | Discharge status: Home / Self Care | Payer: Self-pay | Attending: Urgent Care | Admitting: Urgent Care

## 2020-02-20 ENCOUNTER — Encounter (HOSPITAL_COMMUNITY): Payer: Self-pay

## 2020-02-20 ENCOUNTER — Other Ambulatory Visit: Payer: Self-pay

## 2020-02-20 DIAGNOSIS — J069 Acute upper respiratory infection, unspecified: Secondary | ICD-10-CM

## 2020-02-20 DIAGNOSIS — G43809 Other migraine, not intractable, without status migrainosus: Secondary | ICD-10-CM | POA: Insufficient documentation

## 2020-02-20 DIAGNOSIS — J029 Acute pharyngitis, unspecified: Secondary | ICD-10-CM | POA: Insufficient documentation

## 2020-02-20 DIAGNOSIS — R05 Cough: Secondary | ICD-10-CM | POA: Insufficient documentation

## 2020-02-20 DIAGNOSIS — H938X3 Other specified disorders of ear, bilateral: Secondary | ICD-10-CM | POA: Insufficient documentation

## 2020-02-20 DIAGNOSIS — G43909 Migraine, unspecified, not intractable, without status migrainosus: Secondary | ICD-10-CM

## 2020-02-20 DIAGNOSIS — Z20822 Contact with and (suspected) exposure to covid-19: Secondary | ICD-10-CM | POA: Insufficient documentation

## 2020-02-20 LAB — POCT RAPID STREP A: Streptococcus, Group A Screen (Direct): NEGATIVE

## 2020-02-20 MED ORDER — PROMETHAZINE-DM 6.25-15 MG/5ML PO SYRP: 5.0000 mL | ORAL_SOLUTION | Freq: Every evening | ORAL | 0 refills | Status: DC | PRN

## 2020-02-20 MED ORDER — ONDANSETRON 8 MG PO TBDP: 8.0000 mg | ORAL_TABLET | Freq: Three times a day (TID) | ORAL | 0 refills | Status: DC | PRN

## 2020-02-20 MED ORDER — PSEUDOEPHEDRINE HCL 30 MG PO TABS: 30.0000 mg | ORAL_TABLET | Freq: Three times a day (TID) | ORAL | 0 refills | Status: DC | PRN

## 2020-02-20 MED ORDER — SUMATRIPTAN SUCCINATE 50 MG PO TABS: ORAL_TABLET | ORAL | 5 refills | Status: DC

## 2020-02-20 MED ORDER — CETIRIZINE HCL 10 MG PO TABS: 10.0000 mg | ORAL_TABLET | Freq: Every day | ORAL | 0 refills | Status: DC

## 2020-02-20 MED ORDER — BENZONATATE 100 MG PO CAPS: 100.0000 mg | ORAL_CAPSULE | Freq: Three times a day (TID) | ORAL | 0 refills | Status: DC | PRN

## 2020-02-20 NOTE — ED Triage Notes (Signed)
Pt presents with cough, soret throat, runny nose and migraines x 2 days, Pt not taking medication for the complaints,.

## 2020-02-20 NOTE — Discharge Instructions (Signed)

## 2020-02-20 NOTE — ED Provider Notes (Signed)
[facility]   MRN: [id number] DOB: 04-20-1991  Subjective:   Michele Larson is a 29 y.o. female presenting for 2-day history of runny stuffy nose, sore throat, cough, bilateral ear fullness.  This is elicited her typical migraines.  She is out of her to a triptan medication would like a refill.  Typically she does very well for this.  Denies fever, chest pain, shortness of breath, abdominal pain, rashes.  No current facility-administered medications for this encounter. No current outpatient medications on file.   No Known Allergies  Past Medical History:  Diagnosis Date  . Headache   . Hepatitis B affecting pregnancy      Past Surgical History:  Procedure Laterality Date  . NO PAST SURGERIES      No family history on file.  Social History   Tobacco Use  . Smoking status: Never Smoker  . Smokeless tobacco: Never Used  Substance Use Topics  . Alcohol use: No  . Drug use: No    ROS   Objective:   Vitals: BP 125/90 (BP Location: Left Arm)   Pulse 98   Temp 97.6 F (36.4 C) (Oral)   Resp 18   LMP  (Within Months) Comment: 1 month  SpO2 99%   Breastfeeding Unknown   Physical Exam Constitutional:      General: She is not in acute distress.    Appearance: Normal appearance. She is well-developed. She is not ill-appearing, toxic-appearing or diaphoretic.  HENT:     Head: Normocephalic and atraumatic.     Right Ear: Ear canal and external ear normal. No drainage or tenderness. A middle ear effusion is present. There is no impacted cerumen. Tympanic membrane is not erythematous.     Left Ear: Ear canal and external ear normal. No drainage or tenderness. A middle ear effusion is present. There is no impacted cerumen. Tympanic membrane is not erythematous.     Nose: Nose normal. No congestion or rhinorrhea.     Mouth/Throat:     Mouth: Mucous membranes are moist. No oral lesions.     Pharynx: No pharyngeal swelling, oropharyngeal exudate, posterior  oropharyngeal erythema or uvula swelling.     Tonsils: No tonsillar exudate or tonsillar abscesses.  Eyes:     General: No scleral icterus.       Right eye: No discharge.        Left eye: No discharge.     Extraocular Movements: Extraocular movements intact.     Right eye: Normal extraocular motion.     Left eye: Normal extraocular motion.     Conjunctiva/sclera: Conjunctivae normal.     Pupils: Pupils are equal, round, and reactive to light.  Cardiovascular:     Rate and Rhythm: Normal rate and regular rhythm.     Pulses: Normal pulses.     Heart sounds: Normal heart sounds. No murmur heard.  No friction rub. No gallop.   Pulmonary:     Effort: Pulmonary effort is normal. No respiratory distress.     Breath sounds: Normal breath sounds. No stridor. No wheezing, rhonchi or rales.  Musculoskeletal:     Cervical back: Normal range of motion and neck supple.  Lymphadenopathy:     Cervical: No cervical adenopathy.  Skin:    General: Skin is warm and dry.     Findings: No rash.  Neurological:     General: No focal deficit present.     Mental Status: She is alert and oriented to person, place, and time.  Cranial Nerves: No cranial nerve deficit.     Motor: No weakness.     Coordination: Coordination normal.     Gait: Gait normal.     Deep Tendon Reflexes: Reflexes normal.  Psychiatric:        Mood and Affect: Mood normal.        Behavior: Behavior normal.        Thought Content: Thought content normal.        Judgment: Judgment normal.     Results for orders placed or performed during the hospital encounter of 02/20/20 (from the past 24 hour(s))  POCT rapid strep A [facility])     Status: None   Collection Time: 02/20/20  9:26 AM  Result Value Ref Range   Streptococcus, Group A Screen (Direct) NEGATIVE NEGATIVE     Assessment and Plan :   PDMP not reviewed this encounter.  1. Viral URI with cough   2. Sore throat   3. Ear fullness, bilateral   4. Other  migraine without status migrainosus, not intractable     Physical exam findings occluding neurologic exam.  Vital signs reassuring and stable for outpatient management.  COVID-19 and strep culture pending.  Recommend supportive care, refill her migraine medication. Counseled patient on potential for adverse effects with medications prescribed/recommended today, ER and return-to-clinic precautions discussed, patient verbalized understanding.    Wallis Bamberg, [place] 02/20/20 [phone]

## 2020-02-21 LAB — SARS CORONAVIRUS 2 (TAT 6-24 HRS): SARS Coronavirus 2: NEGATIVE

## 2020-02-22 LAB — CULTURE, GROUP A STREP (THRC)

## 2020-08-03 IMAGING — US US OB < 14 WEEKS - US OB TV
1 series · 15 of 28 positions shown · non-contrast
Comparison: None.

CLINICAL DATA: 28-year-old pregnant female with abdominal pain.
LMP: 09/03/2019 corresponding to an estimated gestational age of 7
weeks, 3 days.

EXAM:
OBSTETRIC <14 WK US AND TRANSVAGINAL OB US
TECHNIQUE: Both transabdominal and transvaginal ultrasound examinations were
performed for complete evaluation of the gestation as well as the
maternal uterus, adnexal regions, and pelvic cul-de-sac.
Transvaginal technique was performed to assess early pregnancy.

[Series 1: us ob < 14 weeks - us ob tv · 15 of 70 slices shown]
[im 1/70]
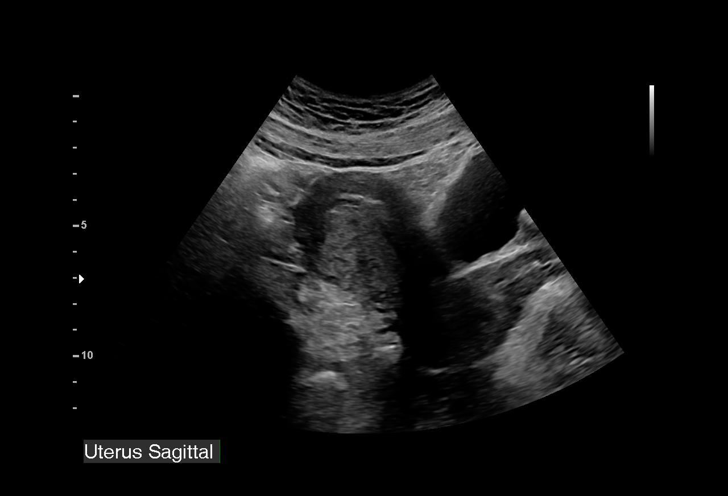
[im 6/70]
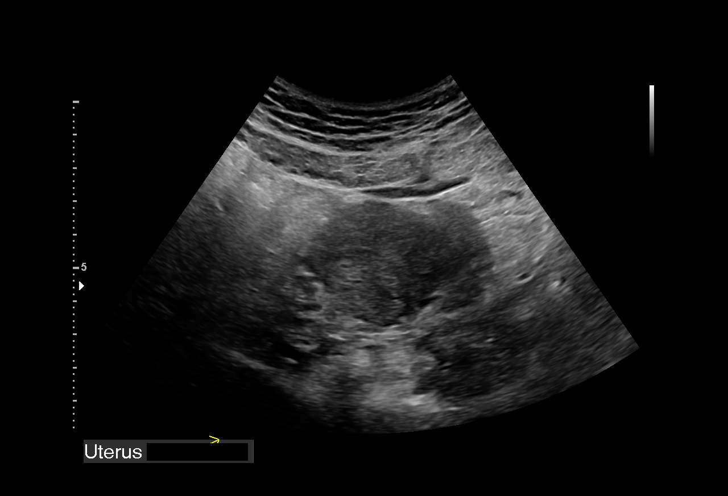
[im 11/70]
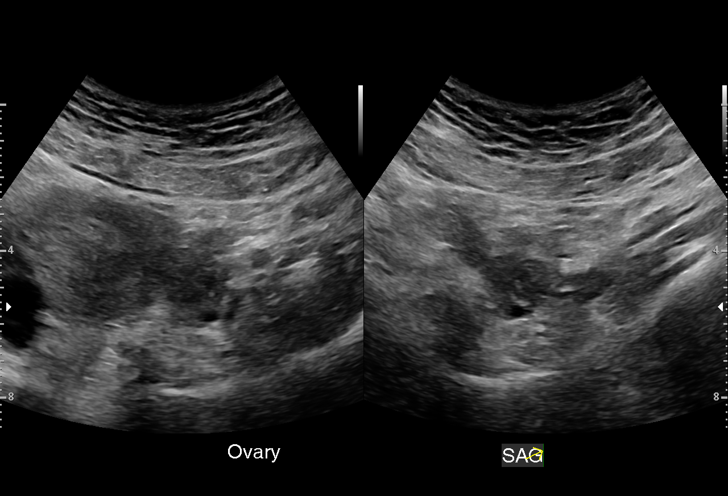
[im 16/70]
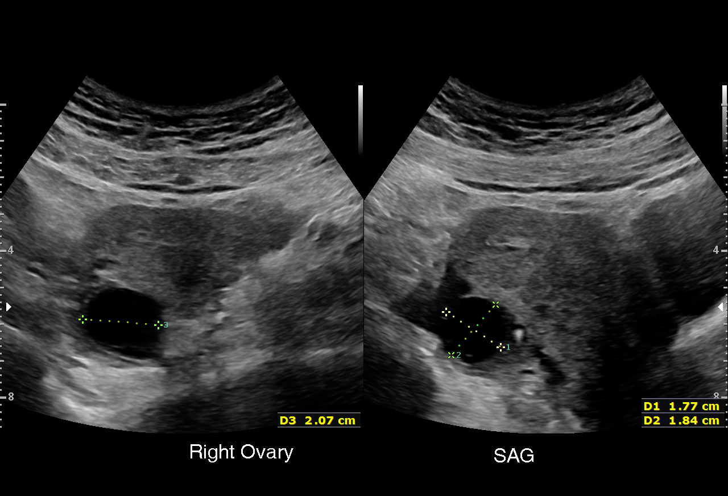
[im 21/70]
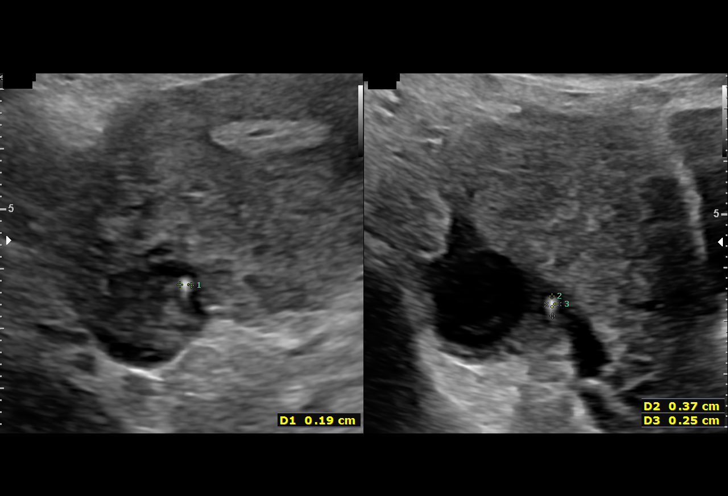
[im 26/70]
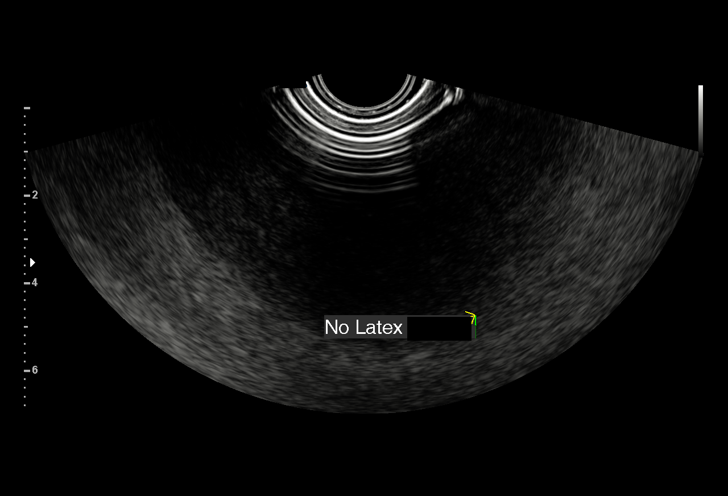
[im 31/70]
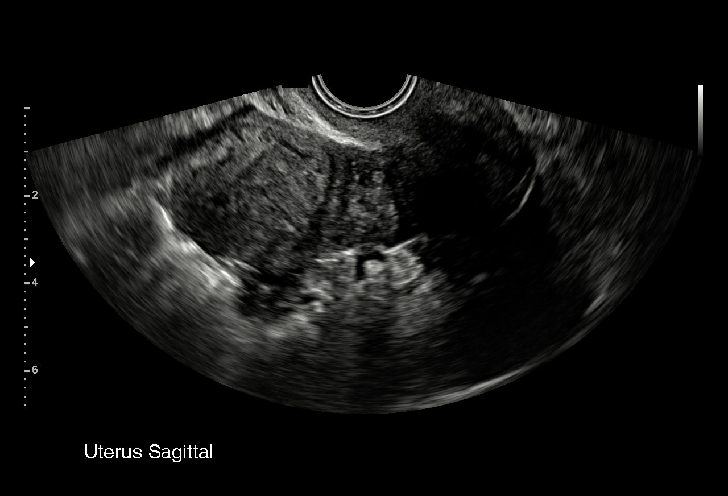
[im 36/70]
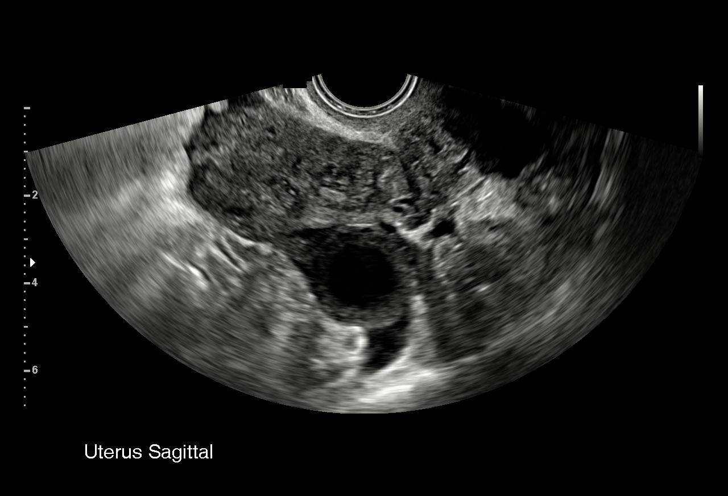
[im 39/70]
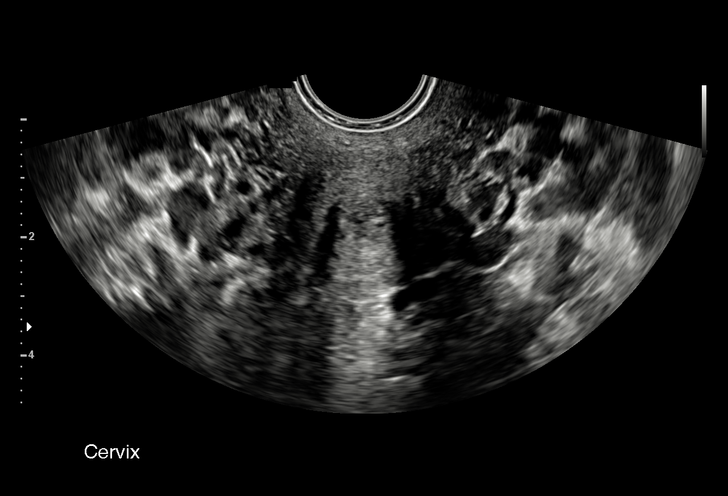
[im 44/70]
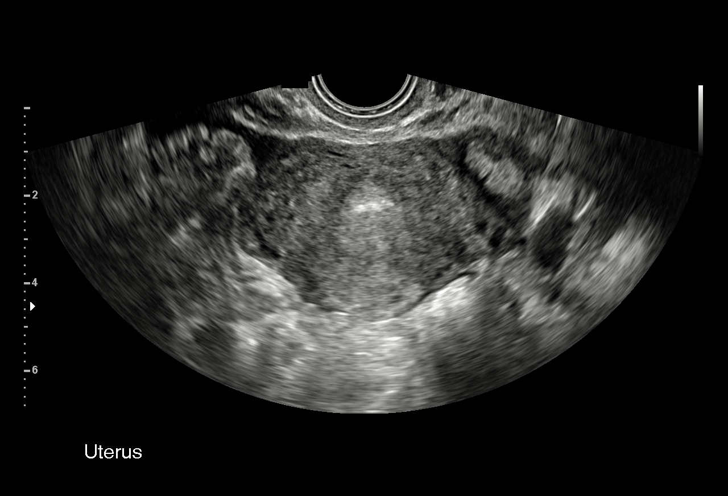
[im 49/70]
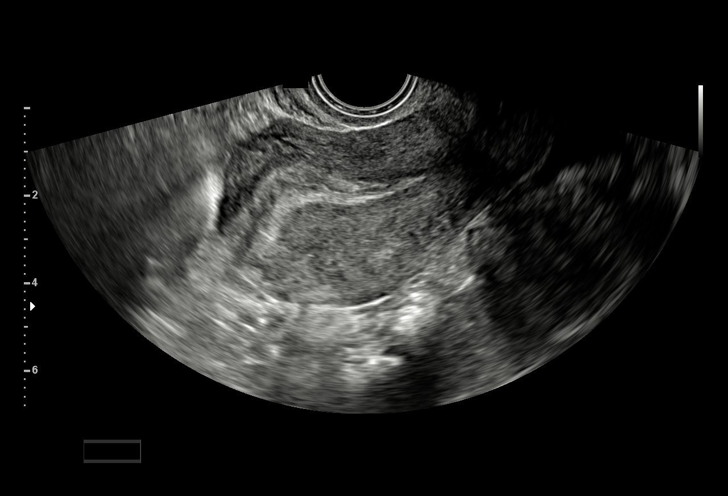
[im 54/70]
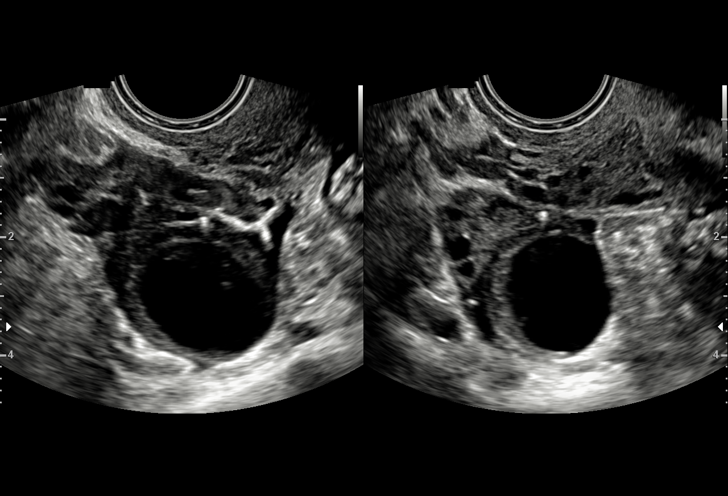
[im 59/70]
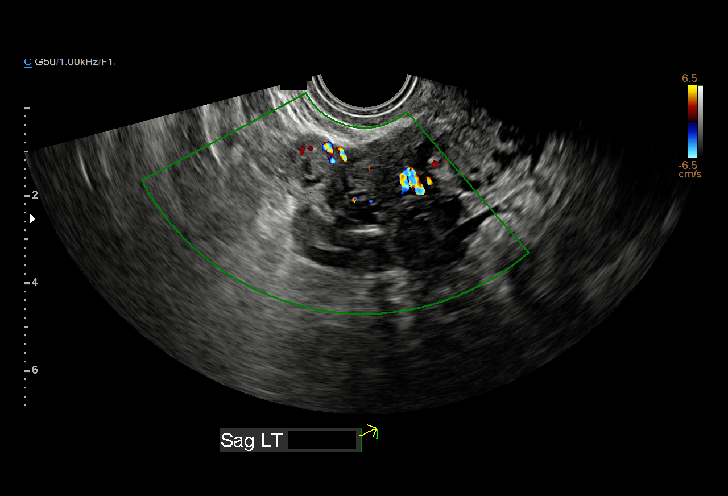
[im 64/70]
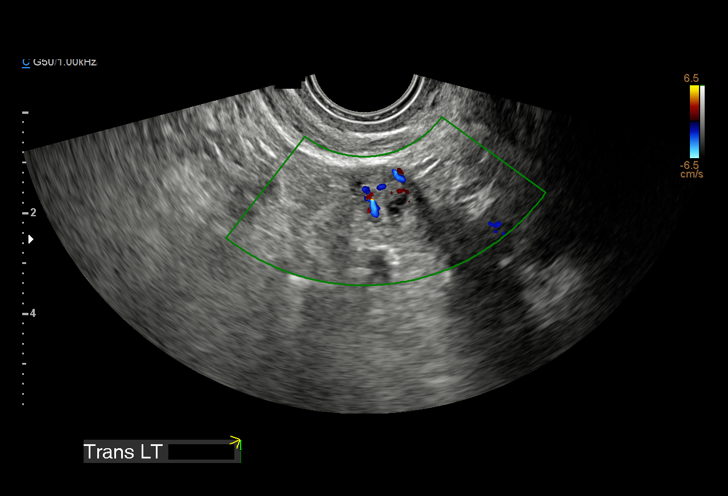
[im 70/70]
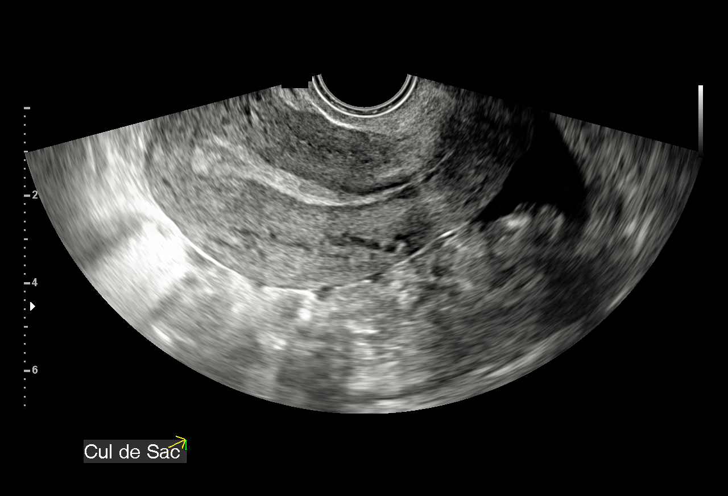

[15 of 28 positions shown; findings below may reference images not displayed]

FINDINGS: The uterus is anteverted appears unremarkable.

The endometrium measures approximately 1 cm in thickness and appears
unremarkable. No intrauterine pregnancy identified.

The right ovary measures 3.5 x 2.5 x 3.1 cm. There is a 2 cm cyst in
the right ovary.

The left ovary is unremarkable and measures 3.7 x 1.8 x 1.5 cm

There is a 1.4 x 1.1 x 1.2 cm complex predominantly solid lesion in
the region of the left adnexa superior to the left ovary concerning
for an ectopic pregnancy. A small cystic structure within this mass
may represent a yolk sac. No fetal pole identified.

There is a small amount of simple appearing free fluid within the
pelvis.
IMPRESSION: 1. No intrauterine pregnancy identified.
2. Rounded solid lesion superior to the left ovary most concerning
for an ectopic pregnancy. Clinical correlation and obstetrical
consult is advised.

These results were called by telephone at the time of interpretation
on 10/25/2019 at [DATE] to nurse [REDACTED] Tiger, who verbally
acknowledged these results.

## 2022-05-24 ENCOUNTER — Ambulatory Visit (INDEPENDENT_AMBULATORY_CARE_PROVIDER_SITE_OTHER): Payer: Medicaid Other

## 2022-05-24 ENCOUNTER — Ambulatory Visit: Payer: Medicaid Other | Admitting: *Deleted

## 2022-05-24 VITALS — BP 121/81 | HR 88 | Ht <= 58 in | Wt 127.0 lb

## 2022-05-24 DIAGNOSIS — O3680X0 Pregnancy with inconclusive fetal viability, not applicable or unspecified: Secondary | ICD-10-CM | POA: Diagnosis not present

## 2022-05-24 DIAGNOSIS — Z3480 Encounter for supervision of other normal pregnancy, unspecified trimester: Secondary | ICD-10-CM

## 2022-05-24 MED ORDER — BLOOD PRESSURE KIT DEVI: 1.0000 | 0 refills | Status: DC

## 2022-05-24 MED ORDER — PROMETHAZINE HCL 25 MG PO TABS: 25.0000 mg | ORAL_TABLET | Freq: Four times a day (QID) | ORAL | 1 refills | Status: DC | PRN

## 2022-05-24 NOTE — Progress Notes (Signed)
New OB Intake  I connected withNAME@ on 05/24/22 at  8:15 AM EDT by In Person Visit and verified that I am speaking with the correct person using two identifiers. Nurse is located at [facility] and pt is located at [place].  I discussed the limitations, risks, security and privacy concerns of performing an evaluation and management service by telephone and the availability of in person appointments. I also discussed with the patient that there may be a patient responsible charge related to this service. The patient expressed understanding and agreed to proceed.  I explained I am completing New OB Intake today. We discussed EDD of 12/08/22 that is based on LMP of 03/03/22. Pt is G3/P1. I reviewed her allergies, medications, Medical/Surgical/OB history, and appropriate screenings. I informed her of [facility] services. [facility] information placed in AVS. Based on history, this is a high risk pregnancy.  Patient Active Problem List   Diagnosis Date Noted   Ectopic pregnancy 10/28/2019   Status post vacuum-assisted vaginal delivery 01/21/2015   Third degree laceration of perineum during delivery, postpartum 01/21/2015   Active labor at term 01/19/2015   Evaluate anatomy not seen on prior sonogram    Fetal growth problem suspected but not found    [redacted] weeks gestation of pregnancy    Encounter for fetal anatomic survey    [redacted] weeks gestation of pregnancy     Concerns addressed today  Delivery Plans Plans to deliver at [facility]. Patient given information for [facility] Healthy [facility] website for more information about [facility] and [place]. Patient is not interested in water birth. Offered upcoming OB visit with CNM to discuss further.  MyChart/Babyscripts MyChart access verified. I explained pt will have some visits in office and some virtually. Babyscripts instructions given and order placed. Patient verifies receipt of registration text/e-mail. Account successfully created and app downloaded.  Blood Pressure  Cuff/Weight Scale Blood pressure cuff ordered for patient to pick-up from [employer]. Explained after first prenatal appt pt will check weekly and document in 31. Patient does not have weight scale; patient may purchase if they desire to track weight weekly in Babyscripts.  Anatomy US Explained first scheduled [place] will be around 19 weeks. Anatomy US scheduled for 19 wks at [facility]. Pt notified to arrive at TBD.  Labs Discussed Johnsie Cancel genetic screening with patient. Would like both Panorama and Horizon drawn at new OB visit. Routine prenatal labs needed.  COVID Vaccine Patient has not had COVID vaccine.  Social Determinants of Health Food Insecurity: Patient denies food insecurity. WIC Referral: Patient is interested in referral to [facility].  Transportation: Patient denies transportation needs. Childcare: Discussed no children allowed at ultrasound appointments. Offered childcare services; patient declines childcare services at this time.  First visit review I reviewed new OB appt with patient. I explained they will have a provider visit that includes labs and a pelvic exam. Explained pt will be seen by Fatima Blank, CNM at first visit; encounter routed to appropriate provider. Explained that patient will be seen by pregnancy navigator following visit with provider.   Penny Pia, RN 05/24/2022  8:15 AM

## 2022-06-28 ENCOUNTER — Encounter: Payer: Self-pay | Admitting: Obstetrics and Gynecology

## 2022-06-28 ENCOUNTER — Ambulatory Visit (INDEPENDENT_AMBULATORY_CARE_PROVIDER_SITE_OTHER): Payer: Medicaid Other | Admitting: Obstetrics and Gynecology

## 2022-06-28 ENCOUNTER — Institutional Professional Consult (permissible substitution): Payer: Medicaid Other | Admitting: Licensed Clinical Social Worker

## 2022-06-28 ENCOUNTER — Other Ambulatory Visit (HOSPITAL_COMMUNITY)
Admission: RE | Admit: 2022-06-28 | Discharge: 2022-06-28 | Disposition: A | Discharge status: Home / Self Care | Payer: Medicaid Other | Source: Ambulatory Visit | Attending: Advanced Practice Midwife | Admitting: Advanced Practice Midwife

## 2022-06-28 VITALS — BP 116/77 | HR 84 | Wt 138.0 lb

## 2022-06-28 DIAGNOSIS — O09292 Supervision of pregnancy with other poor reproductive or obstetric history, second trimester: Secondary | ICD-10-CM

## 2022-06-28 DIAGNOSIS — Z3480 Encounter for supervision of other normal pregnancy, unspecified trimester: Secondary | ICD-10-CM | POA: Insufficient documentation

## 2022-06-28 DIAGNOSIS — O09299 Supervision of pregnancy with other poor reproductive or obstetric history, unspecified trimester: Secondary | ICD-10-CM | POA: Insufficient documentation

## 2022-06-28 DIAGNOSIS — Z3482 Encounter for supervision of other normal pregnancy, second trimester: Secondary | ICD-10-CM

## 2022-06-28 DIAGNOSIS — Z3A16 16 weeks gestation of pregnancy: Secondary | ICD-10-CM

## 2022-06-28 NOTE — Progress Notes (Addendum)
  Subjective:    Michele Larson is a G3P1011 [redacted]w[redacted]d being seen today for her first obstetrical visit.  Her obstetrical history is significant for obesity, previous pregnancy complicated by shoulder dystocia with a 7lb 3ox infant. Patient also had a 3rd degree laceration. Patient does intend to breast feed. Pregnancy history fully reviewed.  Patient reports no complaints.  Vitals:   06/28/22 1512  BP: 116/77  Pulse: 84  Weight: 138 lb (62.6 kg)    HISTORY: OB History  Gravida Para Term Preterm AB Living  3 1 1  0 1 1  SAB IAB Ectopic Multiple Live Births  0 0 1 0 1    # Outcome Date GA Lbr Len/2nd Weight Sex Delivery Anes PTL Lv  3 Current           2 Ectopic 2021          1 Term 01/19/15 [redacted]w[redacted]d 24:52 / 04:22 7 lb 2.8 oz (3.255 kg) F Vag-Vacuum EPI  LIV   Past Medical History:  Diagnosis Date   Headache    Hepatitis B affecting pregnancy    Past Surgical History:  Procedure Laterality Date   NO PAST SURGERIES     No family history on file.   Exam    Uterus:   16-weeks  Pelvic Exam:    Perineum: No Hemorrhoids, Normal Perineum   Vulva: normal   Vagina:  normal mucosa, normal discharge   pH:    Cervix: multiparous appearance and cervix is closed and long   Adnexa: normal adnexa and no mass, fullness, tenderness   Bony Pelvis: gynecoid  System: Breast:  normal appearance, no masses or tenderness   Skin: normal coloration and turgor, no rashes    Neurologic: oriented, no focal deficits   Extremities: normal strength, tone, and muscle mass   HEENT extra ocular movement intact   Mouth/Teeth mucous membranes moist, pharynx normal without lesions and dental hygiene good   Neck supple and no masses   Cardiovascular: regular rate and rhythm   Respiratory:  neck free of mass or lymphadenopathy, chest clear, no wheezing, crepitations, rhonchi, normal symmetric air entry   Abdomen: soft, non-tender; bowel sounds normal; no masses,  no organomegaly   Urinary:        Assessment:    Pregnancy: G3P1011 Patient Active Problem List   Diagnosis Date Noted   History of shoulder dystocia in prior pregnancy, currently pregnant 06/28/2022   Supervision of other normal pregnancy, antepartum 05/24/2022   Ectopic pregnancy 10/28/2019   Third degree laceration of perineum during delivery, postpartum 01/21/2015        Plan:     Initial labs drawn. Prenatal vitamins. Problem list reviewed and updated. Genetic Screening discussed : Panorama and AFP ordered.  Ultrasound discussed; fetal survey: ordered. Briefly discussed options of PLTCS given history of shoulder dystocia. Discussion to be continued at a later visit  Follow up in 4 weeks. 50% of 30 min visit spent on counseling and coordination of care.     Koven Belinsky 06/28/2022

## 2022-06-28 NOTE — Patient Instructions (Signed)

## 2022-06-28 NOTE — Progress Notes (Signed)
Pt complains of nipple itching.

## 2022-06-29 LAB — CERVICOVAGINAL ANCILLARY ONLY
Chlamydia: NEGATIVE
Comment: NEGATIVE
Comment: NORMAL
Neisseria Gonorrhea: NEGATIVE

## 2022-06-29 LAB — CYTOLOGY - PAP
Comment: NEGATIVE
Diagnosis: NEGATIVE
High risk HPV: NEGATIVE

## 2022-06-30 LAB — CULTURE, OB URINE

## 2022-06-30 LAB — URINE CULTURE, OB REFLEX

## 2022-07-01 LAB — CBC/D/PLT+RPR+RH+ABO+RUBIGG...
Basophils Absolute: 0 10*3/uL (ref 0.0–0.2)
Basos: 0 %
EOS (ABSOLUTE): 0.4 10*3/uL (ref 0.0–0.4)
Eos: 3 %
HCV Ab: NONREACTIVE
HIV Screen 4th Generation wRfx: NONREACTIVE
Hematocrit: 36.2 % (ref 34.0–46.6)
Hemoglobin: 12.1 g/dL (ref 11.1–15.9)
Immature Grans (Abs): 0.1 10*3/uL (ref 0.0–0.1)
Immature Granulocytes: 1 %
Lymphocytes Absolute: 3.1 10*3/uL (ref 0.7–3.1)
Lymphs: 24 %
MCH: 24.6 pg — ABNORMAL LOW (ref 26.6–33.0)
MCHC: 33.4 g/dL (ref 31.5–35.7)
MCV: 74 fL — ABNORMAL LOW (ref 79–97)
Monocytes Absolute: 0.6 10*3/uL (ref 0.1–0.9)
Monocytes: 5 %
Neutrophils Absolute: 8.8 10*3/uL — ABNORMAL HIGH (ref 1.4–7.0)
Neutrophils: 67 %
Platelets: 298 10*3/uL (ref 150–450)
RBC: 4.91 x10E6/uL (ref 3.77–5.28)
RDW: 14.9 % (ref 11.7–15.4)
RPR Ser Ql: NONREACTIVE
Rh Factor: POSITIVE
Rubella Antibodies, IGG: 1.49 index (ref >0.99)
WBC: 13 10*3/uL — ABNORMAL HIGH (ref 3.4–10.8)

## 2022-07-01 LAB — AB SCR+ANTIBODY ID

## 2022-07-01 LAB — HCV INTERPRETATION

## 2022-07-01 LAB — AFP, SERUM, OPEN SPINA BIFIDA
AFP MoM: 1.16
AFP Value: 44.2 ng/mL
Gest. Age on Collection Date: 16.5 weeks
Maternal Age At EDD: 30.6 yr
OSBR Risk 1 IN: 7366
Test Results:: NEGATIVE
Weight: 138 [lb_av]

## 2022-07-01 LAB — HEMOGLOBIN A1C
Est. average glucose Bld gHb Est-mCnc: 100 mg/dL
Hgb A1c MFr Bld: 5.1 % (ref 4.8–5.6)

## 2022-07-01 LAB — HEPATITIS B SURFACE AG, CONFIRM: HBsAG Confirmation: POSITIVE — AB

## 2022-07-13 ENCOUNTER — Encounter: Payer: Self-pay | Admitting: Obstetrics and Gynecology

## 2022-07-13 ENCOUNTER — Ambulatory Visit: Payer: Medicaid Other | Attending: Obstetrics and Gynecology

## 2022-07-13 ENCOUNTER — Ambulatory Visit: Payer: Medicaid Other | Admitting: *Deleted

## 2022-07-13 ENCOUNTER — Other Ambulatory Visit: Payer: Self-pay | Admitting: *Deleted

## 2022-07-13 VITALS — BP 117/67 | HR 83

## 2022-07-13 DIAGNOSIS — O09299 Supervision of pregnancy with other poor reproductive or obstetric history, unspecified trimester: Secondary | ICD-10-CM

## 2022-07-13 DIAGNOSIS — O98419 Viral hepatitis complicating pregnancy, unspecified trimester: Secondary | ICD-10-CM | POA: Insufficient documentation

## 2022-07-13 DIAGNOSIS — Z363 Encounter for antenatal screening for malformations: Secondary | ICD-10-CM | POA: Diagnosis present

## 2022-07-13 DIAGNOSIS — O4442 Low lying placenta NOS or without hemorrhage, second trimester: Secondary | ICD-10-CM

## 2022-07-13 DIAGNOSIS — O4440 Low lying placenta NOS or without hemorrhage, unspecified trimester: Secondary | ICD-10-CM | POA: Insufficient documentation

## 2022-07-13 DIAGNOSIS — B1910 Unspecified viral hepatitis B without hepatic coma: Secondary | ICD-10-CM | POA: Insufficient documentation

## 2022-07-13 DIAGNOSIS — O09292 Supervision of pregnancy with other poor reproductive or obstetric history, second trimester: Secondary | ICD-10-CM | POA: Insufficient documentation

## 2022-07-13 DIAGNOSIS — Z3480 Encounter for supervision of other normal pregnancy, unspecified trimester: Secondary | ICD-10-CM | POA: Diagnosis not present

## 2022-07-13 DIAGNOSIS — Z3A18 18 weeks gestation of pregnancy: Secondary | ICD-10-CM | POA: Insufficient documentation

## 2022-07-13 DIAGNOSIS — O98412 Viral hepatitis complicating pregnancy, second trimester: Secondary | ICD-10-CM | POA: Diagnosis not present

## 2022-07-13 DIAGNOSIS — D563 Thalassemia minor: Secondary | ICD-10-CM | POA: Insufficient documentation

## 2022-07-13 DIAGNOSIS — Z362 Encounter for other antenatal screening follow-up: Secondary | ICD-10-CM

## 2022-07-13 LAB — HORIZON CUSTOM: REPORT SUMMARY: POSITIVE — AB

## 2022-07-13 LAB — PANORAMA PRENATAL TEST FULL PANEL:PANORAMA TEST PLUS 5 ADDITIONAL MICRODELETIONS: FETAL FRACTION: 7.3

## 2022-07-29 ENCOUNTER — Encounter: Payer: Self-pay | Admitting: Student

## 2022-07-29 ENCOUNTER — Ambulatory Visit (INDEPENDENT_AMBULATORY_CARE_PROVIDER_SITE_OTHER): Payer: Medicaid Other | Admitting: Student

## 2022-07-29 VITALS — BP 132/72 | HR 87 | Wt 132.0 lb

## 2022-07-29 DIAGNOSIS — Z3480 Encounter for supervision of other normal pregnancy, unspecified trimester: Secondary | ICD-10-CM

## 2022-07-29 DIAGNOSIS — D563 Thalassemia minor: Secondary | ICD-10-CM

## 2022-07-29 DIAGNOSIS — O4442 Low lying placenta NOS or without hemorrhage, second trimester: Secondary | ICD-10-CM

## 2022-07-29 DIAGNOSIS — O4440 Low lying placenta NOS or without hemorrhage, unspecified trimester: Secondary | ICD-10-CM

## 2022-07-29 DIAGNOSIS — Z3A21 21 weeks gestation of pregnancy: Secondary | ICD-10-CM

## 2022-07-29 DIAGNOSIS — Z789 Other specified health status: Secondary | ICD-10-CM

## 2022-07-29 DIAGNOSIS — Z148 Genetic carrier of other disease: Secondary | ICD-10-CM

## 2022-07-29 NOTE — Progress Notes (Signed)
   PRENATAL VISIT NOTE  Subjective:  Michele Larson is a 31 y.o. G3P1011 at [redacted]w[redacted]d being seen today for ongoing prenatal care.  She is currently monitored for the following issues for this low-risk pregnancy and has Third degree laceration of perineum during delivery, postpartum; Ectopic pregnancy; Supervision of other normal pregnancy, antepartum; History of shoulder dystocia in prior pregnancy, currently pregnant; Hepatitis B affecting pregnancy; Low lying placenta, antepartum; and Alpha thalassemia silent carrier on their problem list.  Patient reports no complaints.  Contractions: Not present. Vag. Bleeding: None.  Movement: Present. Denies leaking of fluid.   The following portions of the patient's history were reviewed and updated as appropriate: allergies, current medications, past family history, past medical history, past social history, past surgical history and problem list.   Objective:   Vitals:   07/29/22 0823  BP: 132/72  Pulse: 87  Weight: 132 lb (59.9 kg)    Fetal Status: Fetal Heart Rate (bpm): 155   Movement: Present     General:  Alert, oriented and cooperative. Patient is in no acute distress.  Skin: Skin is warm and dry. No rash noted.   Cardiovascular: Normal heart rate noted  Respiratory: Normal respiratory effort, no problems with respiration noted  Abdomen: Soft, gravid, appropriate for gestational age.  Pain/Pressure: Absent     Pelvic: Cervical exam deferred        Extremities: Normal range of motion.  Edema: Trace  Mental Status: Normal mood and affect. Normal behavior. Normal judgment and thought content.   Assessment and Plan:  Pregnancy: G3P1011 at [redacted]w[redacted]d 1. Supervision of other normal pregnancy, antepartum - Doing well, no complaints - Reports fetal movement - Fundal height at umbilicus   2. [redacted] weeks gestation of pregnancy - Plan for routine follow-up  3. Alpha thalassemia silent carrier 4. 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 Low lying placenta, antepartum - Stable, No vaginal bleeding  - Follow-up growth scan scheduled  6. Language barrier affecting health care - interpreter present at bedside   Preterm labor symptoms and general obstetric precautions including but not limited to vaginal bleeding, contractions, leaking of fluid and fetal movement were reviewed in detail with the patient. Please refer to After Visit Summary for other counseling recommendations.   Return in about 1 month (around 08/29/2022) for IN-PERSON, LOB.  Future Appointments  Date Time Provider Department Center  08/10/2022  7:15 AM [facility] NURSE [facility]  08/10/2022  7:30 AM [facility] US2 [facility]  09/20/2022  7:30 AM [facility] NURSE [facility]  09/20/2022  7:45 AM [facility] US4 [facility]    Corlis Hove, NP

## 2022-07-29 NOTE — Progress Notes (Signed)
Pt presents for ROB without concerns today.  

## 2022-08-02 NOTE — L&D Delivery Note (Addendum)
Delivery Note Called to room and patient was complete and pushing. Head delivered. Nuchal cord X 1 present which was not easily reducible and therefore delivered through. Shoulder and body delivered in usual fashion. At  2002 a viable female was delivered via Vaginal, Spontaneous (Presentation: LOA).  Infant with spontaneous cry, placed on mother's abdomen, dried and stimulated. Cord clamped x 2 after 1-minute delay, and cut by FOB. Cord blood drawn. Placenta delivered spontaneously with gentle cord traction. Appears intact. Fundus firm with massage and Pitocin. Labia, perineum, vagina, and cervix inspected with hemostatic 1st degree perineal tear. Copper IUD was placed post placental without issues, see separate note.     APGAR: 8, 9; weight pending .   Cord: 3VC with the following complications: none.   Cord pH: n/a  Anesthesia: Epidural Episiotomy: None Lacerations: 1st degree perineal laceration not requiring sutures Est. Blood Loss (mL): 76  Mom to postpartum.  Baby to Couplet care / Skin to Skin.  Gilles Chiquito, MD PGY-3 8:23 PM      Attestation of CNM Supervision of Resident: Evaluation and management procedures were performed by the [facility] Medicine Resident under my supervision. I was immediately present, gloved, and available for direct supervision, assistance and direction throughout this encounter.  I also confirm that I have verified the information documented in the resident's note, and that I have also personally reperformed the pertinent components of the physical exam and all of the medical decision making activities.  I have also made any necessary editorial changes.   Brand Males, CNM 11/24/2022 8:46 PM

## 2022-08-10 ENCOUNTER — Other Ambulatory Visit: Payer: Self-pay | Admitting: *Deleted

## 2022-08-10 ENCOUNTER — Ambulatory Visit: Payer: Medicaid Other | Attending: Maternal & Fetal Medicine

## 2022-08-10 ENCOUNTER — Ambulatory Visit: Payer: Medicaid Other | Admitting: *Deleted

## 2022-08-10 VITALS — BP 123/72 | HR 81

## 2022-08-10 DIAGNOSIS — Z362 Encounter for other antenatal screening follow-up: Secondary | ICD-10-CM

## 2022-08-10 DIAGNOSIS — B1910 Unspecified viral hepatitis B without hepatic coma: Secondary | ICD-10-CM

## 2022-08-10 DIAGNOSIS — D563 Thalassemia minor: Secondary | ICD-10-CM

## 2022-08-10 DIAGNOSIS — O98419 Viral hepatitis complicating pregnancy, unspecified trimester: Secondary | ICD-10-CM | POA: Insufficient documentation

## 2022-08-10 DIAGNOSIS — Z363 Encounter for antenatal screening for malformations: Secondary | ICD-10-CM | POA: Diagnosis not present

## 2022-08-10 DIAGNOSIS — O09292 Supervision of pregnancy with other poor reproductive or obstetric history, second trimester: Secondary | ICD-10-CM | POA: Insufficient documentation

## 2022-08-10 DIAGNOSIS — O98412 Viral hepatitis complicating pregnancy, second trimester: Secondary | ICD-10-CM | POA: Diagnosis not present

## 2022-08-10 DIAGNOSIS — O4440 Low lying placenta NOS or without hemorrhage, unspecified trimester: Secondary | ICD-10-CM | POA: Insufficient documentation

## 2022-08-10 DIAGNOSIS — O09299 Supervision of pregnancy with other poor reproductive or obstetric history, unspecified trimester: Secondary | ICD-10-CM

## 2022-08-10 DIAGNOSIS — O4402 Placenta previa specified as without hemorrhage, second trimester: Secondary | ICD-10-CM | POA: Diagnosis not present

## 2022-08-10 DIAGNOSIS — Z3480 Encounter for supervision of other normal pregnancy, unspecified trimester: Secondary | ICD-10-CM | POA: Insufficient documentation

## 2022-08-10 DIAGNOSIS — Z3A22 22 weeks gestation of pregnancy: Secondary | ICD-10-CM | POA: Diagnosis not present

## 2022-08-10 DIAGNOSIS — O4442 Low lying placenta NOS or without hemorrhage, second trimester: Secondary | ICD-10-CM

## 2022-08-30 ENCOUNTER — Ambulatory Visit: Payer: Medicaid Other | Admitting: *Deleted

## 2022-08-30 ENCOUNTER — Encounter: Payer: Self-pay | Admitting: *Deleted

## 2022-08-30 ENCOUNTER — Ambulatory Visit: Payer: Medicaid Other | Attending: Obstetrics

## 2022-08-30 ENCOUNTER — Encounter: Payer: Self-pay | Admitting: Family Medicine

## 2022-08-30 ENCOUNTER — Ambulatory Visit (INDEPENDENT_AMBULATORY_CARE_PROVIDER_SITE_OTHER): Payer: Medicaid Other | Admitting: Family Medicine

## 2022-08-30 VITALS — BP 121/77 | HR 94 | Wt 139.2 lb

## 2022-08-30 VITALS — BP 131/69 | HR 80

## 2022-08-30 DIAGNOSIS — O4440 Low lying placenta NOS or without hemorrhage, unspecified trimester: Secondary | ICD-10-CM

## 2022-08-30 DIAGNOSIS — Z148 Genetic carrier of other disease: Secondary | ICD-10-CM

## 2022-08-30 DIAGNOSIS — O99891 Other specified diseases and conditions complicating pregnancy: Secondary | ICD-10-CM

## 2022-08-30 DIAGNOSIS — B1910 Unspecified viral hepatitis B without hepatic coma: Secondary | ICD-10-CM | POA: Insufficient documentation

## 2022-08-30 DIAGNOSIS — O98412 Viral hepatitis complicating pregnancy, second trimester: Secondary | ICD-10-CM

## 2022-08-30 DIAGNOSIS — Z3A25 25 weeks gestation of pregnancy: Secondary | ICD-10-CM | POA: Insufficient documentation

## 2022-08-30 DIAGNOSIS — O4442 Low lying placenta NOS or without hemorrhage, second trimester: Secondary | ICD-10-CM | POA: Insufficient documentation

## 2022-08-30 DIAGNOSIS — O09292 Supervision of pregnancy with other poor reproductive or obstetric history, second trimester: Secondary | ICD-10-CM | POA: Diagnosis not present

## 2022-08-30 DIAGNOSIS — Z3480 Encounter for supervision of other normal pregnancy, unspecified trimester: Secondary | ICD-10-CM | POA: Diagnosis present

## 2022-08-30 DIAGNOSIS — O98419 Viral hepatitis complicating pregnancy, unspecified trimester: Secondary | ICD-10-CM | POA: Diagnosis present

## 2022-08-30 DIAGNOSIS — O09299 Supervision of pregnancy with other poor reproductive or obstetric history, unspecified trimester: Secondary | ICD-10-CM

## 2022-08-30 DIAGNOSIS — D563 Thalassemia minor: Secondary | ICD-10-CM | POA: Diagnosis not present

## 2022-08-30 NOTE — Progress Notes (Signed)
Pt presents for ROB visit. No concerns at this time.  

## 2022-08-30 NOTE — Progress Notes (Unsigned)
   PRENATAL VISIT NOTE  Subjective:  Michele Larson is a 32 y.o. G3P1011 at [redacted]w[redacted]d being seen today for ongoing prenatal care.  She is currently monitored for the following issues for this {Blank single:19197::"high-risk","low-risk"} pregnancy and has Third degree laceration of perineum during delivery, postpartum; Ectopic pregnancy; Supervision of other normal pregnancy, antepartum; History of shoulder dystocia in prior pregnancy, currently pregnant; Hepatitis B affecting pregnancy; Low lying placenta, antepartum; and Alpha thalassemia silent carrier on their problem list.  Patient reports no complaints. ***  Contractions: Not present. Vag. Bleeding: None.  Movement: Present. Denies leaking of fluid.   The following portions of the patient's history were reviewed and updated as appropriate: allergies, current medications, past family history, past medical history, past social history, past surgical history and problem list.   Objective:   Vitals:   08/30/22 0908  BP: 121/77  Pulse: 94  Weight: 139 lb 3.2 oz (63.1 kg)    Fetal Status: Fetal Heart Rate (bpm): 150   Movement: Present     General:  Alert, oriented and cooperative. Patient is in no acute distress.  Skin: Skin is warm and dry. No rash noted.   Cardiovascular: Normal heart rate noted  Respiratory: Normal respiratory effort, no problems with respiration noted  Abdomen: Soft, gravid, appropriate for gestational age.  Pain/Pressure: Absent     Pelvic: {Blank single:19197::"Cervical exam performed in the presence of a chaperone","Cervical exam deferred"}        Extremities: Normal range of motion.  Edema: Trace  Mental Status: Normal mood and affect. Normal behavior. Normal judgment and thought content.   Assessment and Plan:  Pregnancy: G3P1011 at [redacted]w[redacted]d  1. Hepatitis B affecting pregnancy - Comprehensive metabolic panel - Hepatitis B DNA, ultraquantitative, PCR - Hepatitis B E Antibody - Hepatitis B E Antigen  2. Alpha  thalassemia silent carrier  3. Low lying placenta, antepartum  4. Supervision of other normal pregnancy, antepartum    Preterm labor symptoms and general obstetric precautions including but not limited to vaginal bleeding, contractions, leaking of fluid and fetal movement were reviewed in detail with the patient. Please refer to After Visit Summary for other counseling recommendations.   Return in about 2 weeks (around 09/13/2022) for ROB, Glucola.  Future Appointments  Date Time Provider [place]  08/30/2022  3:15 PM [facility] NURSE [facility]  08/30/2022  3:30 PM [facility] US3 [facility]  09/20/2022  7:30 AM [facility] NURSE [facility]  09/20/2022  7:45 AM [facility] US4 [facility] [place], DO FMOB Fellow, Faculty practice [place] [facility] 08/30/22  9:42 AM

## 2022-09-01 LAB — COMPREHENSIVE METABOLIC PANEL
ALT: 16 IU/L (ref 0–32)
AST: 15 IU/L (ref 0–40)
Albumin/Globulin Ratio: 1.3 (ref 1.2–2.2)
Albumin: 3.9 g/dL — ABNORMAL LOW (ref 4.0–5.0)
Alkaline Phosphatase: 75 IU/L (ref 44–121)
BUN/Creatinine Ratio: 12 (ref 9–23)
BUN: 5 mg/dL — ABNORMAL LOW (ref 6–20)
Bilirubin Total: <0.2 mg/dL (ref 0.0–1.2)
CO2: 19 mmol/L — ABNORMAL LOW (ref 20–29)
Calcium: 9.3 mg/dL (ref 8.7–10.2)
Chloride: 100 mmol/L (ref 96–106)
Creatinine, Ser: 0.41 mg/dL — ABNORMAL LOW (ref 0.57–1.00)
Globulin, Total: 3 g/dL (ref 1.5–4.5)
Glucose: 68 mg/dL — ABNORMAL LOW (ref 70–99)
Potassium: 4.4 mmol/L (ref 3.5–5.2)
Sodium: 137 mmol/L (ref 134–144)
Total Protein: 6.9 g/dL (ref 6.0–8.5)
eGFR: 136 mL/min/{1.73_m2} (ref >59)

## 2022-09-01 LAB — HEPATITIS B E ANTIGEN: Hep B E Ag: NEGATIVE

## 2022-09-01 LAB — HEPATITIS B DNA, ULTRAQUANTITATIVE, PCR: HBV DNA SERPL PCR-ACNC: NOT DETECTED IU/mL

## 2022-09-01 LAB — HEPATITIS B E ANTIBODY: Hep B E Ab: POSITIVE — AB

## 2022-09-13 ENCOUNTER — Encounter: Payer: Self-pay | Admitting: Obstetrics and Gynecology

## 2022-09-13 ENCOUNTER — Ambulatory Visit (INDEPENDENT_AMBULATORY_CARE_PROVIDER_SITE_OTHER): Payer: Medicaid Other | Admitting: Obstetrics and Gynecology

## 2022-09-13 ENCOUNTER — Other Ambulatory Visit: Payer: Medicaid Other

## 2022-09-13 VITALS — BP 133/84 | HR 98 | Wt 140.3 lb

## 2022-09-13 DIAGNOSIS — D563 Thalassemia minor: Secondary | ICD-10-CM

## 2022-09-13 DIAGNOSIS — Z3480 Encounter for supervision of other normal pregnancy, unspecified trimester: Secondary | ICD-10-CM

## 2022-09-13 DIAGNOSIS — O98412 Viral hepatitis complicating pregnancy, second trimester: Secondary | ICD-10-CM

## 2022-09-13 DIAGNOSIS — Z23 Encounter for immunization: Secondary | ICD-10-CM | POA: Diagnosis not present

## 2022-09-13 DIAGNOSIS — B1910 Unspecified viral hepatitis B without hepatic coma: Secondary | ICD-10-CM

## 2022-09-13 DIAGNOSIS — O09299 Supervision of pregnancy with other poor reproductive or obstetric history, unspecified trimester: Secondary | ICD-10-CM

## 2022-09-13 DIAGNOSIS — O09292 Supervision of pregnancy with other poor reproductive or obstetric history, second trimester: Secondary | ICD-10-CM

## 2022-09-13 DIAGNOSIS — Z3A27 27 weeks gestation of pregnancy: Secondary | ICD-10-CM

## 2022-09-13 NOTE — Progress Notes (Signed)
Pt presents for ROB visit. TDaP given. No concerns at this time.  PHQ - 0 GAD - 0

## 2022-09-13 NOTE — Progress Notes (Addendum)
   PRENATAL VISIT NOTE  Subjective:  Michele Larson is a 32 y.o. G3P1011 at [redacted]w[redacted]d being seen today for ongoing prenatal care.  She is currently monitored for the following issues for this high-risk pregnancy and has Third degree laceration of perineum during delivery, postpartum; Supervision of other normal pregnancy, antepartum; History of shoulder dystocia in prior pregnancy, currently pregnant; Hepatitis B affecting pregnancy; and Alpha thalassemia silent carrier on their problem list.  Patient reports no complaints.  Contractions: Not present. Vag. Bleeding: None.  Movement: Present. Denies leaking of fluid.   The following portions of the patient's history were reviewed and updated as appropriate: allergies, current medications, past family history, past medical history, past social history, past surgical history and problem list.   Objective:   Vitals:   09/13/22 0840  BP: 133/84  Pulse: 98  Weight: 140 lb 4.8 oz (63.6 kg)   Fetal Status: Fetal Heart Rate (bpm): 153 Fundal Height: 25 cm Movement: Present     General:  Alert, oriented and cooperative. Patient is in no acute distress.  Skin: Skin is warm and dry. No rash noted.   Cardiovascular: Normal heart rate noted  Respiratory: Normal respiratory effort, no problems with respiration noted  Abdomen: Soft, gravid, appropriate for gestational age.  Pain/Pressure: Absent      Assessment and Plan:  Pregnancy: G3P1011 at [redacted]w[redacted]d 1. Supervision of other normal pregnancy, antepartum Tdap today - CBC - Glucose Tolerance, 2 Hours w/1 Hour - HIV Antibody (routine testing w rflx) - RPR  2. Hepatitis B affecting pregnancy 1/29 @ [redacted]w[redacted]d: VL negative, HBeAb pos, HBeAg neg, ALT 16, AST 15 Next LFTs/VL at 37w  3. Alpha thalassemia silent carrier Partner test kit given today  4. History of shoulder dystocia in prior pregnancy, currently pregnant 5. Third degree laceration of perineum during delivery, postpartum  Please refer to After  Visit Summary for other counseling recommendations.   Return in about 2 weeks (around 09/27/2022) for return OB at 29-30 weeks.  Future Appointments  Date Time Provider [place]  09/13/2022 11:15 AM Inez Catalina, MD [facility] None  10/04/2022  9:15 AM [facility] NURSE [facility]  10/04/2022  9:30 AM [facility] US3 [facility]    Inez Catalina, MD

## 2022-09-14 ENCOUNTER — Other Ambulatory Visit: Payer: Self-pay | Admitting: *Deleted

## 2022-09-14 ENCOUNTER — Encounter: Payer: Self-pay | Admitting: Obstetrics and Gynecology

## 2022-09-14 DIAGNOSIS — O24410 Gestational diabetes mellitus in pregnancy, diet controlled: Secondary | ICD-10-CM

## 2022-09-14 DIAGNOSIS — O24419 Gestational diabetes mellitus in pregnancy, unspecified control: Secondary | ICD-10-CM | POA: Insufficient documentation

## 2022-09-14 LAB — GLUCOSE TOLERANCE, 2 HOURS W/ 1HR
Glucose, 1 hour: 213 mg/dL — ABNORMAL HIGH (ref 70–179)
Glucose, 2 hour: 185 mg/dL — ABNORMAL HIGH (ref 70–152)
Glucose, Fasting: 73 mg/dL (ref 70–91)

## 2022-09-14 LAB — HIV ANTIBODY (ROUTINE TESTING W REFLEX): HIV Screen 4th Generation wRfx: NONREACTIVE

## 2022-09-14 LAB — CBC
Hematocrit: 37.6 % (ref 34.0–46.6)
Hemoglobin: 12.3 g/dL (ref 11.1–15.9)
MCH: 25.2 pg — ABNORMAL LOW (ref 26.6–33.0)
MCHC: 32.7 g/dL (ref 31.5–35.7)
MCV: 77 fL — ABNORMAL LOW (ref 79–97)
Platelets: 282 10*3/uL (ref 150–450)
RBC: 4.89 x10E6/uL (ref 3.77–5.28)
RDW: 14.2 % (ref 11.7–15.4)
WBC: 15.9 10*3/uL — ABNORMAL HIGH (ref 3.4–10.8)

## 2022-09-14 LAB — RPR: RPR Ser Ql: NONREACTIVE

## 2022-09-14 MED ORDER — ACCU-CHEK GUIDE VI STRP: ORAL_STRIP | 12 refills | Status: DC

## 2022-09-14 MED ORDER — ACCU-CHEK GUIDE W/DEVICE KIT: 1.0000 | PACK | Freq: Four times a day (QID) | 0 refills | Status: DC

## 2022-09-14 MED ORDER — ACCU-CHEK SOFTCLIX LANCETS MISC: 1.0000 | Freq: Four times a day (QID) | 12 refills | Status: DC

## 2022-09-14 NOTE — Progress Notes (Signed)
Pt returned call. Called her back using [place] interpreter. Pt advised of GDM, referral to diabetes educator, meter and supplies sent and need to pick up meter and supplies and bring to education appt. Advised of education sent in [place] via [place]. All questions were answered. Pt verbalized understanding.

## 2022-09-14 NOTE — Progress Notes (Signed)
TC using [place] interpreter. No answer. Left HIPAA compliant VM with call back number. MyChart education on GDM sent with [place] translation. RX Accu check meter and supplies sent. Referral to Diabetes educator sent. Message forwarded to scheduler to change to MD appt and ensure [place] scheduled appropriately.

## 2022-09-20 ENCOUNTER — Ambulatory Visit: Payer: Medicaid Other

## 2022-09-23 ENCOUNTER — Encounter: Payer: Medicaid Other | Attending: Obstetrics and Gynecology | Admitting: Registered"

## 2022-09-23 ENCOUNTER — Ambulatory Visit (INDEPENDENT_AMBULATORY_CARE_PROVIDER_SITE_OTHER): Payer: Medicaid Other | Admitting: Registered"

## 2022-09-23 DIAGNOSIS — Z3A29 29 weeks gestation of pregnancy: Secondary | ICD-10-CM

## 2022-09-23 DIAGNOSIS — Z713 Dietary counseling and surveillance: Secondary | ICD-10-CM | POA: Diagnosis not present

## 2022-09-23 DIAGNOSIS — O24419 Gestational diabetes mellitus in pregnancy, unspecified control: Secondary | ICD-10-CM

## 2022-09-23 DIAGNOSIS — O24410 Gestational diabetes mellitus in pregnancy, diet controlled: Secondary | ICD-10-CM | POA: Diagnosis present

## 2022-09-23 NOTE — Progress Notes (Signed)
Patient was seen for Gestational Diabetes self-management on 09/23/22  Start time 0810 and End time 0907   Estimated due date: 12/08/22; [redacted]w[redacted]d Amn video Interpreter KimHaong #[phone] Clinical: Medications: reviewed Medical History: reviewed Labs: OGTT 73-213(H)-185(H), A1c 5.1%  11/27  Dietary and Lifestyle History: Pt states she was diagnosed with GDM 3 weeks ago and made changes including cutting out Starbucks drinks, eating brown rice instead of white rice, cooking dinner at home and eating dinner earlier.  Pt reports eating dairy 3x/week.  Physical Activity: not assessed Stress: not assessed Sleep: not assessed  24 hr Recall:  First Meal: Starbucks sandwich sometimes only eats 1/2, water (43g carb, 21 g pro, 13 g sat fat) Snack: 3 strawberries Second meal: vietnamese sandwich  Snack: Third meal: spring rolls, noodle, eggs Snack: Beverages: water, coconut water  NUTRITION INTERVENTION  Nutrition education (E-1) on the following topics:   Initial Follow-up  [x]$  []$  Definition of Gestational Diabetes [x]$  []$  Why dietary management is important in controlling blood glucose [x]$  []$  Effects each nutrient has on blood glucose levels [x]$  []$  Simple carbohydrates vs complex carbohydrates []$  []$  Fluid intake [x]$  []$  Creating a balanced meal plan [x]$  []$  Carbohydrate counting  [x]$  []$  When to check blood glucose levels [x]$  []$  Proper blood glucose monitoring techniques [x]$  []$  Effect of stress and stress reduction techniques  [x]$  []$  Exercise effect on blood glucose levels, appropriate exercise during pregnancy []$  []$  Importance of limiting caffeine and abstaining from alcohol and smoking []$  []$  Medications used for blood sugar control during pregnancy []$  []$  Hypoglycemia and rule of 15 [x]$  []$  Postpartum self care (only emphasized this should not be temporary change and whole family can benefit from recommendations.)  Patient has a meter prior to visit. Patient is testing but did not  understand timing of testing. Random checks over the last couple of days are 78-179 mg/dL  Patient instructed to monitor glucose levels: FBS: 60 - ? 95 mg/dL; 2 hour: ? 120 mg/dL  Patient received handouts: per patient request handouts in [place] Nutrition Diabetes and Pregnancy Carbohydrate Counting List  Patient will be seen for follow-up in 2 weeks or as needed.

## 2022-09-30 ENCOUNTER — Ambulatory Visit (INDEPENDENT_AMBULATORY_CARE_PROVIDER_SITE_OTHER): Payer: Medicaid Other | Admitting: Student

## 2022-09-30 VITALS — BP 130/79 | HR 100 | Wt 141.6 lb

## 2022-09-30 DIAGNOSIS — Z789 Other specified health status: Secondary | ICD-10-CM

## 2022-09-30 DIAGNOSIS — O24410 Gestational diabetes mellitus in pregnancy, diet controlled: Secondary | ICD-10-CM

## 2022-09-30 DIAGNOSIS — Z3480 Encounter for supervision of other normal pregnancy, unspecified trimester: Secondary | ICD-10-CM

## 2022-09-30 DIAGNOSIS — B1910 Unspecified viral hepatitis B without hepatic coma: Secondary | ICD-10-CM

## 2022-09-30 DIAGNOSIS — Z3A30 30 weeks gestation of pregnancy: Secondary | ICD-10-CM

## 2022-09-30 DIAGNOSIS — O98413 Viral hepatitis complicating pregnancy, third trimester: Secondary | ICD-10-CM

## 2022-09-30 NOTE — Progress Notes (Signed)
   PRENATAL VISIT NOTE  Subjective:  Michele Larson is a 32 y.o. G3P1011 at 54w1dbeing seen today for ongoing prenatal care.  She is currently monitored for the following issues for this low-risk pregnancy and has Third degree laceration of perineum during delivery, postpartum; Supervision of other normal pregnancy, antepartum; History of shoulder dystocia in prior pregnancy, currently pregnant; Hepatitis B affecting pregnancy; Alpha thalassemia silent carrier; and GDM (gestational diabetes mellitus) on their problem list.  Patient reports no complaints.  Contractions: Not present. Vag. Bleeding: None.  Movement: Present. Denies leaking of fluid.   The following portions of the patient's history were reviewed and updated as appropriate: allergies, current medications, past family history, past medical history, past social history, past surgical history and problem list.   Objective:   Vitals:   09/30/22 1041  BP: 130/79  Pulse: 100  Weight: 141 lb 9.6 oz (64.2 kg)    Fetal Status: Fetal Heart Rate (bpm): 146   Movement: Present     General:  Alert, oriented and cooperative. Patient is in no acute distress.  Skin: Skin is warm and dry. No rash noted.   Cardiovascular: Normal heart rate noted  Respiratory: Normal respiratory effort, no problems with respiration noted  Abdomen: Soft, gravid, appropriate for gestational age.  Pain/Pressure: Absent     Pelvic: Cervical exam deferred        Extremities: Normal range of motion.  Edema: Trace  Mental Status: Normal mood and affect. Normal behavior. Normal judgment and thought content.   Assessment and Plan:  Pregnancy: G3P1011 at 339w1d. Supervision of other normal pregnancy, antepartum - doing well, reports vigorous fetal movement   2. [redacted] weeks gestation of pregnancy - Routine prenatal care  3. Diet controlled gestational diabetes mellitus (GDM) in third trimester -  Upcoming Growth Scan  -  GDM education class has been completed  and has follow-up visit scheduled - Patient brought log to visit. FBS all <85. 3 abnormal PP BS, 141, 132 and 139. Patient able to recall eating a double portion of carbs prior to those readings and having an extra snack before the 141 reading. Patient able to verbalize lifestyle modifications and is determined to continue incorporation of information from GDM class. Reviewed benefits of incorporating more proteins to curb cravings.   4. Language barrier affecting health care - Interpreter present at the bedside  5. Hepatitis B affecting pregnancy - recheck CMP, HBV DNA [id number] @37w$ )   Preterm labor symptoms and general obstetric precautions including but not limited to vaginal bleeding, contractions, leaking of fluid and fetal movement were reviewed in detail with the patient. Please refer to After Visit Summary for other counseling recommendations.   No follow-ups on file.  Future Appointments  Date Time Provider [place]/11/2022  9:15 AM [facility]/11/2022  9:30 AM [facility]01/2023  8:15 AM [facility]/18/2024  8:35 AM ArnWoodroe ModeD [facility] [place]    NicJohnston EbbsP

## 2022-09-30 NOTE — Progress Notes (Signed)
Pt presents for ROB visit. No concerns at this time.

## 2022-10-04 ENCOUNTER — Ambulatory Visit: Payer: Medicaid Other | Admitting: *Deleted

## 2022-10-04 ENCOUNTER — Other Ambulatory Visit: Payer: Self-pay | Admitting: *Deleted

## 2022-10-04 ENCOUNTER — Ambulatory Visit: Payer: Medicaid Other | Attending: Maternal & Fetal Medicine

## 2022-10-04 VITALS — BP 132/72 | HR 92

## 2022-10-04 DIAGNOSIS — B1910 Unspecified viral hepatitis B without hepatic coma: Secondary | ICD-10-CM | POA: Diagnosis not present

## 2022-10-04 DIAGNOSIS — D582 Other hemoglobinopathies: Secondary | ICD-10-CM

## 2022-10-04 DIAGNOSIS — D563 Thalassemia minor: Secondary | ICD-10-CM

## 2022-10-04 DIAGNOSIS — O24410 Gestational diabetes mellitus in pregnancy, diet controlled: Secondary | ICD-10-CM

## 2022-10-04 DIAGNOSIS — O09299 Supervision of pregnancy with other poor reproductive or obstetric history, unspecified trimester: Secondary | ICD-10-CM

## 2022-10-04 DIAGNOSIS — Z3A30 30 weeks gestation of pregnancy: Secondary | ICD-10-CM

## 2022-10-04 DIAGNOSIS — O24419 Gestational diabetes mellitus in pregnancy, unspecified control: Secondary | ICD-10-CM | POA: Insufficient documentation

## 2022-10-04 DIAGNOSIS — Z362 Encounter for other antenatal screening follow-up: Secondary | ICD-10-CM | POA: Diagnosis present

## 2022-10-04 DIAGNOSIS — O09293 Supervision of pregnancy with other poor reproductive or obstetric history, third trimester: Secondary | ICD-10-CM

## 2022-10-04 DIAGNOSIS — O98413 Viral hepatitis complicating pregnancy, third trimester: Secondary | ICD-10-CM | POA: Diagnosis not present

## 2022-10-04 DIAGNOSIS — O98419 Viral hepatitis complicating pregnancy, unspecified trimester: Secondary | ICD-10-CM | POA: Insufficient documentation

## 2022-10-04 DIAGNOSIS — O4442 Low lying placenta NOS or without hemorrhage, second trimester: Secondary | ICD-10-CM | POA: Diagnosis present

## 2022-10-07 ENCOUNTER — Ambulatory Visit (INDEPENDENT_AMBULATORY_CARE_PROVIDER_SITE_OTHER): Payer: Medicaid Other | Admitting: Registered"

## 2022-10-07 ENCOUNTER — Encounter: Payer: Medicaid Other | Attending: Obstetrics and Gynecology | Admitting: Registered"

## 2022-10-07 DIAGNOSIS — O24410 Gestational diabetes mellitus in pregnancy, diet controlled: Secondary | ICD-10-CM | POA: Diagnosis present

## 2022-10-07 DIAGNOSIS — Z713 Dietary counseling and surveillance: Secondary | ICD-10-CM | POA: Insufficient documentation

## 2022-10-07 DIAGNOSIS — Z3A31 31 weeks gestation of pregnancy: Secondary | ICD-10-CM

## 2022-10-07 DIAGNOSIS — O24419 Gestational diabetes mellitus in pregnancy, unspecified control: Secondary | ICD-10-CM

## 2022-10-07 NOTE — Progress Notes (Addendum)
Patient was seen for Gestational Diabetes self-management follow-up on 10/07/22  Start time 0815 and End time 0845   Estimated due date: 12/08/22; [redacted]w[redacted]d Amn video Interpreter AMitzi Hansen#[phone] Clinical: Medications: reviewed Medical History: reviewed Labs: OGTT 73-213(H)-185(H), A1c 5.1%  11/27    Dietary and Lifestyle History: Pt states she stopped getting Starbucks food and drink for breakfast. Pt states she eats morning snack when home but not hungry for one at work. Pt states the days when her blood sugar was high was during family dinners and she ate more of the foods that her family was eating that she usually limits.  Pt states she was busy yesterday, did not have any vegetables usually eats daily and is having dairy most days.  Physical Activity: not assessed Stress: not assessed Sleep: not assessed  24 hr Recall:  First Meal: banana, milk Snack:  Second meal: brown rice and chicken breast  Snack: orange Third meal: brown rice, shrimp Snack: none Beverages: 4 bottles (~16 oz) water, coconut water, milk  NUTRITION INTERVENTION  Nutrition education (E-1) on the following topics:   Initial Follow-up  '[x]'$  '[]'$  Definition of Gestational Diabetes '[x]'$  '[]'$  Why dietary management is important in controlling blood glucose '[x]'$  '[]'$  Effects each nutrient has on blood glucose levels '[x]'$  '[]'$  Simple carbohydrates vs complex carbohydrates '[]'$  '[x]'$  Fluid intake '[x]'$  '[x]'$  Creating a balanced meal plan '[x]'$  '[]'$  Carbohydrate counting  '[x]'$  '[]'$  When to check blood glucose levels '[x]'$  '[]'$  Proper blood glucose monitoring techniques '[x]'$  '[]'$  Effect of stress and stress reduction techniques  '[x]'$  '[]'$  Exercise effect on blood glucose levels, appropriate exercise during pregnancy '[]'$  '[]'$  Importance of limiting caffeine and abstaining from alcohol and smoking '[]'$  '[]'$  Medications used for blood sugar control during pregnancy '[]'$  '[x]'$  Hypoglycemia and rule of 15 '[x]'$  '[x]'$  Postpartum self care (only emphasized this  should not be temporary change and whole family can benefit from recommendations.)  Patient instructed to monitor glucose levels: FBS: 60 - ? 95 mg/dL; 2 hour: ? 120 mg/dL  Patient received handouts: none  Patient will be seen for follow-up in 2 weeks or as needed.

## 2022-10-18 ENCOUNTER — Ambulatory Visit (INDEPENDENT_AMBULATORY_CARE_PROVIDER_SITE_OTHER): Payer: Medicaid Other | Admitting: Obstetrics & Gynecology

## 2022-10-18 VITALS — BP 114/72 | HR 82 | Wt 147.0 lb

## 2022-10-18 DIAGNOSIS — O24419 Gestational diabetes mellitus in pregnancy, unspecified control: Secondary | ICD-10-CM

## 2022-10-18 DIAGNOSIS — B1910 Unspecified viral hepatitis B without hepatic coma: Secondary | ICD-10-CM

## 2022-10-18 DIAGNOSIS — O98413 Viral hepatitis complicating pregnancy, third trimester: Secondary | ICD-10-CM

## 2022-10-18 DIAGNOSIS — Z3A32 32 weeks gestation of pregnancy: Secondary | ICD-10-CM

## 2022-10-18 DIAGNOSIS — Z3480 Encounter for supervision of other normal pregnancy, unspecified trimester: Secondary | ICD-10-CM

## 2022-10-18 NOTE — Progress Notes (Signed)
   PRENATAL VISIT NOTE  Subjective:  Michele Larson is a 32 y.o. G3P1011 at [redacted]w[redacted]d being seen today for ongoing prenatal care.  She is currently monitored for the following issues for this high-risk pregnancy and has Supervision of other normal pregnancy, antepartum; History of shoulder dystocia in prior pregnancy, currently pregnant; Hepatitis B affecting pregnancy; Alpha thalassemia silent carrier; and GDM (gestational diabetes mellitus) on their problem list.  Patient reports carpal tunnel symptoms and bilateral  .  Contractions: Not present. Vag. Bleeding: None.  Movement: Present. Denies leaking of fluid.   The following portions of the patient's history were reviewed and updated as appropriate: allergies, current medications, past family history, past medical history, past social history, past surgical history and problem list.   Objective:   Vitals:   10/18/22 0820  BP: 114/72  Pulse: 82  Weight: 66.7 kg    Fetal Status: Fetal Heart Rate (bpm): 140 Fundal Height: 32 cm Movement: Present     General:  Alert, oriented and cooperative. Patient is in no acute distress.  Skin: Skin is warm and dry. No rash noted.   Cardiovascular: Normal heart rate noted  Respiratory: Normal respiratory effort, no problems with respiration noted  Abdomen: Soft, gravid, appropriate for gestational age.  Pain/Pressure: Absent     Pelvic: Cervical exam deferred        Extremities: Normal range of motion.  Edema: Trace  Mental Status: Normal mood and affect. Normal behavior. Normal judgment and thought content.   Assessment and Plan:  Pregnancy: G3P1011 at [redacted]w[redacted]d 1. Supervision of other normal pregnancy, antepartum CTS sx, wrist braces ordered  2. Gestational diabetes mellitus (GDM) in third trimester, gestational diabetes method of control unspecified > 80 % in range with diet  3. Hepatitis B affecting pregnancy   Preterm labor symptoms and general obstetric precautions including but not limited  to vaginal bleeding, contractions, leaking of fluid and fetal movement were reviewed in detail with the patient. Please refer to After Visit Summary for other counseling recommendations.   Return in about 2 weeks (around 11/01/2022).  Future Appointments  Date Time Provider [place]  10/21/2022  8:15 AM [facility]  11/01/2022  8:35 AM Shelly Bombard, MD [facility] None  11/08/2022  8:15 AM [facility] NURSE [facility]  11/08/2022  8:30 AM [facility] US2 [facility] [place]    Emeterio Reeve, MD

## 2022-10-18 NOTE — Progress Notes (Deleted)
   PRENATAL VISIT NOTE  Subjective:  Michele Larson is a 32 y.o. G3P1011 at [redacted]w[redacted]d being seen today for ongoing prenatal care.  She is currently monitored for the following issues for this {Blank single:19197::"high-risk","low-risk"} pregnancy and has Supervision of other normal pregnancy, antepartum; History of shoulder dystocia in prior pregnancy, currently pregnant; Hepatitis B affecting pregnancy; Alpha thalassemia silent carrier; and GDM (gestational diabetes mellitus) on their problem list.  Patient reports {sx:14538}.  Contractions: Not present. Vag. Bleeding: None.  Movement: Present. Denies leaking of fluid.   The following portions of the patient's history were reviewed and updated as appropriate: allergies, current medications, past family history, past medical history, past social history, past surgical history and problem list.   Objective:   Vitals:   10/18/22 0820  BP: 114/72  Pulse: 82  Weight: 66.7 kg    Fetal Status: Fetal Heart Rate (bpm): 140   Movement: Present     General:  Alert, oriented and cooperative. Patient is in no acute distress.  Skin: Skin is warm and dry. No rash noted.   Cardiovascular: Normal heart rate noted  Respiratory: Normal respiratory effort, no problems with respiration noted  Abdomen: Soft, gravid, appropriate for gestational age.  Pain/Pressure: Absent     Pelvic: {Blank single:19197::"Cervical exam performed in the presence of a chaperone","Cervical exam deferred"}        Extremities: Normal range of motion.  Edema: Trace  Mental Status: Normal mood and affect. Normal behavior. Normal judgment and thought content.   Assessment and Plan:  Pregnancy: G3P1011 at [redacted]w[redacted]d 1. Supervision of other normal pregnancy, antepartum ***  2. Gestational diabetes mellitus (GDM) in third trimester, gestational diabetes method of control unspecified ***  3. Hepatitis B affecting pregnancy ***  {Blank single:19197::"Term","Preterm"} labor symptoms and  general obstetric precautions including but not limited to vaginal bleeding, contractions, leaking of fluid and fetal movement were reviewed in detail with the patient. Please refer to After Visit Summary for other counseling recommendations.   Return in about 2 weeks (around 11/01/2022).  Future Appointments  Date Time Provider [place]  10/21/2022  8:15 AM [facility] [place] [facility]  11/08/2022  8:15 AM [facility] NURSE [facility]  11/08/2022  8:30 AM [facility] US2 [facility] [place]    Emeterio Reeve, MD

## 2022-10-21 ENCOUNTER — Encounter: Payer: Medicaid Other | Attending: Obstetrics and Gynecology | Admitting: Registered"

## 2022-10-21 ENCOUNTER — Ambulatory Visit (INDEPENDENT_AMBULATORY_CARE_PROVIDER_SITE_OTHER): Payer: Medicaid Other | Admitting: Registered"

## 2022-10-21 DIAGNOSIS — O24419 Gestational diabetes mellitus in pregnancy, unspecified control: Secondary | ICD-10-CM

## 2022-10-21 DIAGNOSIS — O24410 Gestational diabetes mellitus in pregnancy, diet controlled: Secondary | ICD-10-CM | POA: Diagnosis not present

## 2022-10-21 DIAGNOSIS — Z3A33 33 weeks gestation of pregnancy: Secondary | ICD-10-CM

## 2022-10-21 DIAGNOSIS — Z713 Dietary counseling and surveillance: Secondary | ICD-10-CM | POA: Insufficient documentation

## 2022-10-21 NOTE — Progress Notes (Signed)
[name] Patient was seen for Gestational Diabetes self-management follow-up on 10/21/22  Start time 0812 and End time 0842   Estimated due date: 12/08/22; [redacted]w[redacted]d  Next Ob visit 11/01/22 with [facility] from [facility]  Clinical: Medications: reviewed Medical History: reviewed Labs: OGTT 73-213(H)-185(H), A1c 5.1%  11/27  Dietary and Lifestyle History: Pt states she usually eats brown rice, but ran out and had white rice the last few days, but plans to go back to using brown rice.  Pt states she is drinking milk every morning, sometimes at night. Pt states she drinks smoothie during the day with banana, kale, apple. Sometimes has yogurt as snack.  Pt states family likes to keep soda in the refrigerator, usually she doesn't drink it.  Pt has a difficulty staying asleep at night, feels wide awake when wakes up at midnight. Pt states she reads in the evening with her bible study group and family. Pt states she mostly uses phone at night to learn about how to get ready for birth.  Physical Activity: Mondays walks >30 min in park, walking at work rest of the week. Stress: not assessed Sleep: 8-9:30 bedtime, wake up midnight wide awake 2-3 hrs, goes back to sleep, up at 7 get kids ready for school, back to sleep at 9 am to get ready for work. Taking naps during the day when slow at work. Feels like she is not getting enough rest, does not have this issue outside of pregnancy  24 hr Recall:  First Meal: cheerios, milk Snack: banana Second meal: white rice and fish, vegetables  Snack: instant noodle Third meal: (around 6 pm) fried salmon Snack: none Beverages: 4 bottles (~16 oz) water, coconut water, milk  NUTRITION INTERVENTION  Nutrition education (E-1) on the following topics:   Initial Follow-up  [x]  []  Definition of Gestational Diabetes [x]  []  Why dietary management is important in controlling blood glucose [x]  []  Effects each nutrient has on blood glucose  levels [x]  []  Simple carbohydrates vs complex carbohydrates []  [x]  Fluid intake [x]  [x]  Creating a balanced meal plan [x]  []  Carbohydrate counting  [x]  []  When to check blood glucose levels [x]  []  Proper blood glucose monitoring techniques [x]  []  Effect of stress and stress reduction techniques  [x]   [x] Exercise effect on blood glucose levels, appropriate exercise during pregnancy []   [x] Importance of limiting caffeine and abstaining from alcohol and smoking []   [x] Medications used for blood sugar control during pregnancy []  [x]  Hypoglycemia and rule of 15 [x]  [x]  Postpartum self care (only emphasized this should not be temporary change and whole family can benefit from recommendations.)  Patient instructed to monitor glucose levels: FBS: 60 - ? 95 mg/dL; 2 hour: ? 120 mg/dL  Patient received handouts: none  Patient will be seen for follow-up in 2 weeks or as needed.

## 2022-11-01 ENCOUNTER — Encounter: Payer: Self-pay | Admitting: Obstetrics

## 2022-11-01 ENCOUNTER — Ambulatory Visit (INDEPENDENT_AMBULATORY_CARE_PROVIDER_SITE_OTHER): Payer: Medicaid Other | Admitting: Obstetrics

## 2022-11-01 VITALS — BP 123/85 | HR 87 | Wt 148.0 lb

## 2022-11-01 DIAGNOSIS — O0990 Supervision of high risk pregnancy, unspecified, unspecified trimester: Secondary | ICD-10-CM

## 2022-11-01 DIAGNOSIS — O09293 Supervision of pregnancy with other poor reproductive or obstetric history, third trimester: Secondary | ICD-10-CM

## 2022-11-01 DIAGNOSIS — B1910 Unspecified viral hepatitis B without hepatic coma: Secondary | ICD-10-CM

## 2022-11-01 DIAGNOSIS — Z3A34 34 weeks gestation of pregnancy: Secondary | ICD-10-CM

## 2022-11-01 DIAGNOSIS — O0993 Supervision of high risk pregnancy, unspecified, third trimester: Secondary | ICD-10-CM

## 2022-11-01 DIAGNOSIS — O24410 Gestational diabetes mellitus in pregnancy, diet controlled: Secondary | ICD-10-CM

## 2022-11-01 DIAGNOSIS — O09299 Supervision of pregnancy with other poor reproductive or obstetric history, unspecified trimester: Secondary | ICD-10-CM

## 2022-11-01 DIAGNOSIS — O98413 Viral hepatitis complicating pregnancy, third trimester: Secondary | ICD-10-CM

## 2022-11-01 NOTE — Progress Notes (Signed)
Subjective:  Michele Larson is a 32 y.o. G3P1011 at [redacted]w[redacted]d being seen today for ongoing prenatal care.  She is currently monitored for the following issues for this high-risk pregnancy and has Supervision of other normal pregnancy, antepartum; History of shoulder dystocia in prior pregnancy, currently pregnant; Hepatitis B affecting pregnancy; Alpha thalassemia silent carrier; and GDM (gestational diabetes mellitus) on their problem list.  Patient reports no complaints.  Contractions: Not present. Vag. Bleeding: None.  Movement: Present. Denies leaking of fluid.   The following portions of the patient's history were reviewed and updated as appropriate: allergies, current medications, past family history, past medical history, past social history, past surgical history and problem list. Problem list updated.  Objective:   Vitals:   11/01/22 0839  BP: 123/85  Pulse: 87  Weight: 148 lb (67.1 kg)    Fetal Status: Fetal Heart Rate (bpm): 140   Movement: Present     General:  Alert, oriented and cooperative. Patient is in no acute distress.  Skin: Skin is warm and dry. No rash noted.   Cardiovascular: Normal heart rate noted  Respiratory: Normal respiratory effort, no problems with respiration noted  Abdomen: Soft, gravid, appropriate for gestational age. Pain/Pressure: Present     Pelvic:  Cervical exam deferred        Extremities: Normal range of motion.  Edema: None  Mental Status: Normal mood and affect. Normal behavior. Normal judgment and thought content.   Urinalysis:      Assessment and Plan:  Pregnancy: G3P1011 at [redacted]w[redacted]d  1. Supervision of high risk pregnancy, antepartum  2. Diet controlled gestational diabetes mellitus (GDM) in third trimester - good glucose control:  FBS < 90  and 2 hour PP < 120  3. Hepatitis B affecting pregnancy - clinically stable  4. History of shoulder dystocia in prior pregnancy, currently pregnant    There are no diagnoses linked to this  encounter. Preterm labor symptoms and general obstetric precautions including but not limited to vaginal bleeding, contractions, leaking of fluid and fetal movement were reviewed in detail with the patient. Please refer to After Visit Summary for other counseling recommendations.   Return in about 2 weeks (around 11/15/2022) for [facility].   Shelly Bombard, MD 11/01/22

## 2022-11-04 ENCOUNTER — Other Ambulatory Visit: Payer: Medicaid Other

## 2022-11-08 ENCOUNTER — Ambulatory Visit: Payer: Medicaid Other | Attending: Obstetrics

## 2022-11-08 ENCOUNTER — Other Ambulatory Visit: Payer: Self-pay | Admitting: Obstetrics

## 2022-11-08 ENCOUNTER — Ambulatory Visit: Payer: Medicaid Other | Admitting: *Deleted

## 2022-11-08 VITALS — BP 137/74 | HR 85

## 2022-11-08 DIAGNOSIS — O24410 Gestational diabetes mellitus in pregnancy, diet controlled: Secondary | ICD-10-CM

## 2022-11-08 DIAGNOSIS — O09299 Supervision of pregnancy with other poor reproductive or obstetric history, unspecified trimester: Secondary | ICD-10-CM

## 2022-11-08 DIAGNOSIS — O98413 Viral hepatitis complicating pregnancy, third trimester: Secondary | ICD-10-CM

## 2022-11-08 DIAGNOSIS — Z3689 Encounter for other specified antenatal screening: Secondary | ICD-10-CM | POA: Diagnosis present

## 2022-11-08 DIAGNOSIS — O09293 Supervision of pregnancy with other poor reproductive or obstetric history, third trimester: Secondary | ICD-10-CM

## 2022-11-08 DIAGNOSIS — O98419 Viral hepatitis complicating pregnancy, unspecified trimester: Secondary | ICD-10-CM | POA: Insufficient documentation

## 2022-11-08 DIAGNOSIS — D563 Thalassemia minor: Secondary | ICD-10-CM

## 2022-11-08 DIAGNOSIS — Z3A35 35 weeks gestation of pregnancy: Secondary | ICD-10-CM

## 2022-11-08 DIAGNOSIS — O365930 Maternal care for other known or suspected poor fetal growth, third trimester, not applicable or unspecified: Secondary | ICD-10-CM | POA: Insufficient documentation

## 2022-11-08 DIAGNOSIS — D582 Other hemoglobinopathies: Secondary | ICD-10-CM

## 2022-11-08 DIAGNOSIS — B181 Chronic viral hepatitis B without delta-agent: Secondary | ICD-10-CM

## 2022-11-15 ENCOUNTER — Ambulatory Visit (INDEPENDENT_AMBULATORY_CARE_PROVIDER_SITE_OTHER): Payer: Medicaid Other | Admitting: Obstetrics and Gynecology

## 2022-11-15 ENCOUNTER — Other Ambulatory Visit (HOSPITAL_COMMUNITY)
Admission: RE | Admit: 2022-11-15 | Discharge: 2022-11-15 | Disposition: A | Discharge status: Home / Self Care | Payer: Medicaid Other | Source: Ambulatory Visit | Attending: Obstetrics and Gynecology | Admitting: Obstetrics and Gynecology

## 2022-11-15 VITALS — BP 137/87 | HR 81 | Wt 154.0 lb

## 2022-11-15 DIAGNOSIS — B1910 Unspecified viral hepatitis B without hepatic coma: Secondary | ICD-10-CM

## 2022-11-15 DIAGNOSIS — D563 Thalassemia minor: Secondary | ICD-10-CM

## 2022-11-15 DIAGNOSIS — Z3480 Encounter for supervision of other normal pregnancy, unspecified trimester: Secondary | ICD-10-CM | POA: Insufficient documentation

## 2022-11-15 DIAGNOSIS — Z3490 Encounter for supervision of normal pregnancy, unspecified, unspecified trimester: Secondary | ICD-10-CM

## 2022-11-15 DIAGNOSIS — O24410 Gestational diabetes mellitus in pregnancy, diet controlled: Secondary | ICD-10-CM

## 2022-11-15 DIAGNOSIS — Z3A36 36 weeks gestation of pregnancy: Secondary | ICD-10-CM

## 2022-11-15 DIAGNOSIS — O98419 Viral hepatitis complicating pregnancy, unspecified trimester: Secondary | ICD-10-CM

## 2022-11-15 DIAGNOSIS — O09299 Supervision of pregnancy with other poor reproductive or obstetric history, unspecified trimester: Secondary | ICD-10-CM

## 2022-11-15 DIAGNOSIS — O09293 Supervision of pregnancy with other poor reproductive or obstetric history, third trimester: Secondary | ICD-10-CM

## 2022-11-15 NOTE — Progress Notes (Signed)
Pt complains of heartburn. Pt states glucose readings normal at home.

## 2022-11-15 NOTE — Progress Notes (Signed)
   PRENATAL VISIT NOTE  Subjective:  Michele Larson is a 32 y.o. G3P1011 at [redacted]w[redacted]d being seen today for ongoing prenatal care.  She is currently monitored for the following issues for this high-risk pregnancy and has Supervision of other normal pregnancy, antepartum; History of shoulder dystocia in prior pregnancy, currently pregnant; Hepatitis B affecting pregnancy; Alpha thalassemia silent carrier; GDM (gestational diabetes mellitus); and Hx of maternal laceration, 3rd degree, currently pregnant, third trimester on their problem list.  Patient doing well with no acute concerns today. She reports no complaints.  Contractions: Not present. Vag. Bleeding: None.  Movement: Present. Denies leaking of fluid.   The following portions of the patient's history were reviewed and updated as appropriate: allergies, current medications, past family history, past medical history, past social history, past surgical history and problem list. Problem list updated.  Objective:   Vitals:   11/15/22 0856 11/15/22 0905  BP: (!) 137/90 137/87  Pulse: 90 81  Weight: 154 lb (69.9 kg)     Fetal Status: Fetal Heart Rate (bpm): 135 Fundal Height: 37 cm Movement: Present     General:  Alert, oriented and cooperative. Patient is in no acute distress.  Skin: Skin is warm and dry. No rash noted.   Cardiovascular: Normal heart rate noted  Respiratory: Normal respiratory effort, no problems with respiration noted  Abdomen: Soft, gravid, appropriate for gestational age.  Pain/Pressure: Absent     Pelvic: Cervical exam deferred        Extremities: Normal range of motion.  Edema: Trace  Mental Status:  Normal mood and affect. Normal behavior. Normal judgment and thought content.   Assessment and Plan:  Pregnancy: G3P1011 at [redacted]w[redacted]d  1. Supervision of other normal pregnancy, antepartum Continue routine prenatal care Pt declined cervical exam today - Strep Gp B NAA - Cervicovaginal ancillary only( [place])  2.  [redacted] weeks gestation of pregnancy   3. Hepatitis B affecting pregnancy Advise check viral load and LFT at next visit  4. Diet controlled gestational diabetes mellitus (GDM) in third trimester FBS: 79-89 PPBS: 89-125  Good blood sugar control Will schedule IOL between 39-40 weeks  5. History of shoulder dystocia in prior pregnancy, currently pregnant Last growth scan with EFW 16%  6. Alpha thalassemia silent carrier   7. Hx of maternal laceration, 3rd degree, currently pregnant, third trimester Will monitor  Preterm labor symptoms and general obstetric precautions including but not limited to vaginal bleeding, contractions, leaking of fluid and fetal movement were reviewed in detail with the patient.  Please refer to After Visit Summary for other counseling recommendations.   Return in about 1 week (around 11/22/2022) for [facility], in person.   Mariel Aloe, MD Faculty Attending Center for [facility]

## 2022-11-16 ENCOUNTER — Other Ambulatory Visit: Payer: Self-pay

## 2022-11-16 ENCOUNTER — Encounter: Payer: Medicaid Other | Attending: Obstetrics and Gynecology | Admitting: Registered"

## 2022-11-16 ENCOUNTER — Ambulatory Visit (INDEPENDENT_AMBULATORY_CARE_PROVIDER_SITE_OTHER): Payer: Medicaid Other | Admitting: Registered"

## 2022-11-16 DIAGNOSIS — Z713 Dietary counseling and surveillance: Secondary | ICD-10-CM | POA: Insufficient documentation

## 2022-11-16 DIAGNOSIS — Z3A36 36 weeks gestation of pregnancy: Secondary | ICD-10-CM

## 2022-11-16 DIAGNOSIS — O24410 Gestational diabetes mellitus in pregnancy, diet controlled: Secondary | ICD-10-CM | POA: Diagnosis present

## 2022-11-16 LAB — CERVICOVAGINAL ANCILLARY ONLY
Bacterial Vaginitis (gardnerella): NEGATIVE
Candida Glabrata: NEGATIVE
Candida Vaginitis: POSITIVE — AB
Chlamydia: NEGATIVE
Comment: NEGATIVE
Comment: NEGATIVE
Comment: NEGATIVE
Comment: NEGATIVE
Comment: NORMAL
Neisseria Gonorrhea: NEGATIVE

## 2022-11-16 NOTE — Progress Notes (Signed)
Patient was seen for Gestational Diabetes self-management follow-up on 11/16/22  Start time 1010 and End time 1039   Estimated due date: 12/08/22; [redacted]w[redacted]d  Amn video Interpreter [place] [phone]  Clinical: Medications: reviewed Medical History: reviewed Labs: OGTT 73-213(H)-185(H), A1c 5.1%  11/27  SMBG: Pt continues to do well with diet controlled GDM >90% WNL  Dietary and Lifestyle History: Pt states had run out of strips and bought a box of 100, will last through rest of pregnancy.   Pt states she forgot to eat breakfast yesterday, usually eats at work but got busy and forgot. Pt states yesterday was last day working and states she will likely stop skipping breakfast, plans to eat scrambled eggs, milk tomorrow.  Pt states she is planning on breastfeeding. Last pregnancy only breast fed x1 week due to baby did not want to accept breast. Encouraged Pt to seek out lactation specialist if has issues this preg.  Pt states she is not eating rice (brown) more than once a day. Plans to continue her diet and lifestyle changes postpartum.  Physical Activity: walking a lot for last 3 days. Stress: not assessed Sleep: goes to bed at 9 wakes up many times during the night.  24 hr Recall:  First Meal: none Snack: apple, banana Second meal: lettuce, chicken breast Snack: orange Third meal: beef noodle soup Snack: none Beverages: 4 bottles (~16 oz) water, coconut water, milk  NUTRITION INTERVENTION  Nutrition education (E-1) on the following topics:   Initial Follow-up    Definition of Gestational Diabetes   Why dietary management is important in controlling blood glucose   Effects each nutrient has on blood glucose levels    Simple carbohydrates vs complex carbohydrates    Fluid intake   Creating a balanced meal plan   Carbohydrate counting    When to check blood glucose levels   Proper blood glucose monitoring  techniques   Effect of stress and stress reduction techniques     Exercise effect on blood glucose levels, appropriate exercise during pregnancy   Importance of limiting caffeine and abstaining from alcohol and smoking   Medications used for blood sugar control during pregnancy   Hypoglycemia and rule of 15    Postpartum self care / protective effect of breastfeeding  Patient instructed to monitor glucose levels: FBS: 60 - ? 95 mg/dL; 2 hour: ? 308 mg/dL  Patient received handouts: none  Patient will be seen for follow-up as needed.

## 2022-11-17 LAB — STREP GP B NAA: Strep Gp B NAA: POSITIVE — AB

## 2022-11-23 ENCOUNTER — Ambulatory Visit (INDEPENDENT_AMBULATORY_CARE_PROVIDER_SITE_OTHER): Payer: Medicaid Other | Admitting: Obstetrics and Gynecology

## 2022-11-23 ENCOUNTER — Encounter (HOSPITAL_COMMUNITY): Payer: Self-pay | Admitting: Obstetrics & Gynecology

## 2022-11-23 ENCOUNTER — Other Ambulatory Visit: Payer: Self-pay

## 2022-11-23 ENCOUNTER — Inpatient Hospital Stay (HOSPITAL_COMMUNITY)
Admission: AD | Admit: 2022-11-23 | Discharge: 2022-11-26 | DRG: 806 | Disposition: A | Discharge status: Home / Self Care | Payer: Medicaid Other | Attending: Obstetrics and Gynecology | Admitting: Obstetrics and Gynecology

## 2022-11-23 VITALS — BP 135/96 | HR 79 | Wt 158.0 lb

## 2022-11-23 DIAGNOSIS — O09299 Supervision of pregnancy with other poor reproductive or obstetric history, unspecified trimester: Secondary | ICD-10-CM

## 2022-11-23 DIAGNOSIS — Z3480 Encounter for supervision of other normal pregnancy, unspecified trimester: Secondary | ICD-10-CM

## 2022-11-23 DIAGNOSIS — O24410 Gestational diabetes mellitus in pregnancy, diet controlled: Secondary | ICD-10-CM

## 2022-11-23 DIAGNOSIS — D563 Thalassemia minor: Secondary | ICD-10-CM

## 2022-11-23 DIAGNOSIS — Z30014 Encounter for initial prescription of intrauterine contraceptive device: Secondary | ICD-10-CM | POA: Diagnosis not present

## 2022-11-23 DIAGNOSIS — O09293 Supervision of pregnancy with other poor reproductive or obstetric history, third trimester: Secondary | ICD-10-CM

## 2022-11-23 DIAGNOSIS — O98419 Viral hepatitis complicating pregnancy, unspecified trimester: Secondary | ICD-10-CM

## 2022-11-23 DIAGNOSIS — Z30430 Encounter for insertion of intrauterine contraceptive device: Secondary | ICD-10-CM | POA: Diagnosis not present

## 2022-11-23 DIAGNOSIS — O139 Gestational [pregnancy-induced] hypertension without significant proteinuria, unspecified trimester: Secondary | ICD-10-CM | POA: Diagnosis present

## 2022-11-23 DIAGNOSIS — B1910 Unspecified viral hepatitis B without hepatic coma: Secondary | ICD-10-CM | POA: Diagnosis present

## 2022-11-23 DIAGNOSIS — O99824 Streptococcus B carrier state complicating childbirth: Secondary | ICD-10-CM | POA: Diagnosis present

## 2022-11-23 DIAGNOSIS — O24419 Gestational diabetes mellitus in pregnancy, unspecified control: Secondary | ICD-10-CM | POA: Diagnosis present

## 2022-11-23 DIAGNOSIS — O134 Gestational [pregnancy-induced] hypertension without significant proteinuria, complicating childbirth: Secondary | ICD-10-CM | POA: Diagnosis present

## 2022-11-23 DIAGNOSIS — O24420 Gestational diabetes mellitus in childbirth, diet controlled: Secondary | ICD-10-CM | POA: Diagnosis present

## 2022-11-23 DIAGNOSIS — Z3A37 37 weeks gestation of pregnancy: Secondary | ICD-10-CM

## 2022-11-23 DIAGNOSIS — O09823 Supervision of pregnancy with history of in utero procedure during previous pregnancy, third trimester: Secondary | ICD-10-CM | POA: Diagnosis not present

## 2022-11-23 DIAGNOSIS — O9842 Viral hepatitis complicating childbirth: Secondary | ICD-10-CM | POA: Diagnosis present

## 2022-11-23 DIAGNOSIS — Z148 Genetic carrier of other disease: Secondary | ICD-10-CM | POA: Diagnosis not present

## 2022-11-23 DIAGNOSIS — B181 Chronic viral hepatitis B without delta-agent: Secondary | ICD-10-CM | POA: Diagnosis present

## 2022-11-23 DIAGNOSIS — Z3490 Encounter for supervision of normal pregnancy, unspecified, unspecified trimester: Secondary | ICD-10-CM

## 2022-11-23 DIAGNOSIS — R030 Elevated blood-pressure reading, without diagnosis of hypertension: Secondary | ICD-10-CM | POA: Diagnosis present

## 2022-11-23 DIAGNOSIS — O99820 Streptococcus B carrier state complicating pregnancy: Secondary | ICD-10-CM | POA: Diagnosis not present

## 2022-11-23 LAB — COMPREHENSIVE METABOLIC PANEL
ALT: 13 U/L (ref 0–44)
AST: 21 U/L (ref 15–41)
Albumin: 2.8 g/dL — ABNORMAL LOW (ref 3.5–5.0)
Alkaline Phosphatase: 315 U/L — ABNORMAL HIGH (ref 38–126)
Anion gap: 13 (ref 5–15)
BUN: 9 mg/dL (ref 6–20)
CO2: 20 mmol/L — ABNORMAL LOW (ref 22–32)
Calcium: 9.1 mg/dL (ref 8.9–10.3)
Chloride: 102 mmol/L (ref 98–111)
Creatinine, Ser: 0.56 mg/dL (ref 0.44–1.00)
GFR, Estimated: >60 mL/min (ref >60)
Glucose, Bld: 73 mg/dL (ref 70–99)
Potassium: 4.1 mmol/L (ref 3.5–5.1)
Sodium: 135 mmol/L (ref 135–145)
Total Bilirubin: 0.4 mg/dL (ref 0.3–1.2)
Total Protein: 7.3 g/dL (ref 6.5–8.1)

## 2022-11-23 LAB — URINALYSIS, ROUTINE W REFLEX MICROSCOPIC
Bilirubin Urine: NEGATIVE
Glucose, UA: NEGATIVE mg/dL
Ketones, ur: NEGATIVE mg/dL
Nitrite: NEGATIVE
Protein, ur: NEGATIVE mg/dL
Specific Gravity, Urine: 1.012 (ref 1.005–1.030)
pH: 6 (ref 5.0–8.0)

## 2022-11-23 LAB — PROTEIN / CREATININE RATIO, URINE
Creatinine, Urine: 82 mg/dL
Protein Creatinine Ratio: 0.28 mg/mg{Cre} — ABNORMAL HIGH (ref 0.00–0.15)
Total Protein, Urine: 23 mg/dL

## 2022-11-23 LAB — CBC
HCT: 41.2 % (ref 36.0–46.0)
Hemoglobin: 13.1 g/dL (ref 12.0–15.0)
MCH: 23.4 pg — ABNORMAL LOW (ref 26.0–34.0)
MCHC: 31.8 g/dL (ref 30.0–36.0)
MCV: 73.4 fL — ABNORMAL LOW (ref 80.0–100.0)
Platelets: 242 10*3/uL (ref 150–400)
RBC: 5.61 MIL/uL — ABNORMAL HIGH (ref 3.87–5.11)
RDW: 14.1 % (ref 11.5–15.5)
WBC: 9.9 10*3/uL (ref 4.0–10.5)
nRBC: 0 % (ref 0.0–0.2)

## 2022-11-23 LAB — RPR: RPR Ser Ql: NONREACTIVE

## 2022-11-23 LAB — TYPE AND SCREEN
ABO/RH(D): B POS
Antibody Screen: NEGATIVE

## 2022-11-23 LAB — GLUCOSE, CAPILLARY
Glucose-Capillary: 73 mg/dL (ref 70–99)
Glucose-Capillary: 76 mg/dL (ref 70–99)
Glucose-Capillary: 86 mg/dL (ref 70–99)
Glucose-Capillary: 86 mg/dL (ref 70–99)

## 2022-11-23 MED ORDER — OXYTOCIN-SODIUM CHLORIDE 30-0.9 UT/500ML-% IV SOLN
2.5000 [IU]/h | INTRAVENOUS | Status: DC
  Administered 2022-11-24: 2.5 [IU]/h via INTRAVENOUS
  Filled 2022-11-23 (×3): qty 500

## 2022-11-23 MED ORDER — PENICILLIN G POT IN DEXTROSE 60000 UNIT/ML IV SOLN
3.0000 10*6.[IU] | INTRAVENOUS | Status: DC
  Administered 2022-11-24 (×5): 3 10*6.[IU] via INTRAVENOUS
  Filled 2022-11-23 (×5): qty 50

## 2022-11-23 MED ORDER — LACTATED RINGERS IV SOLN: INTRAVENOUS | Status: DC

## 2022-11-23 MED ORDER — OXYCODONE-ACETAMINOPHEN 5-325 MG PO TABS: 1.0000 | ORAL_TABLET | ORAL | Status: DC | PRN

## 2022-11-23 MED ORDER — ACETAMINOPHEN 325 MG PO TABS: 650.0000 mg | ORAL_TABLET | ORAL | Status: DC | PRN

## 2022-11-23 MED ORDER — LIDOCAINE HCL (PF) 1 % IJ SOLN: 30.0000 mL | INTRAMUSCULAR | Status: DC | PRN

## 2022-11-23 MED ORDER — FENTANYL CITRATE (PF) 100 MCG/2ML IJ SOLN
50.0000 ug | INTRAMUSCULAR | Status: DC | PRN
  Administered 2022-11-24: 100 ug via INTRAVENOUS
  Filled 2022-11-23 (×2): qty 2

## 2022-11-23 MED ORDER — SOD CITRATE-CITRIC ACID 500-334 MG/5ML PO SOLN: 30.0000 mL | ORAL | Status: DC | PRN

## 2022-11-23 MED ORDER — TERBUTALINE SULFATE 1 MG/ML IJ SOLN: 0.2500 mg | Freq: Once | INTRAMUSCULAR | Status: DC | PRN

## 2022-11-23 MED ORDER — PARAGARD INTRAUTERINE COPPER IU IUD
1.0000 | INTRAUTERINE_SYSTEM | Freq: Once | INTRAUTERINE | Status: AC
  Administered 2022-11-24: 1 via INTRAUTERINE
  Filled 2022-11-23: qty 1

## 2022-11-23 MED ORDER — SODIUM CHLORIDE 0.9 % IV SOLN
5.0000 10*6.[IU] | Freq: Once | INTRAVENOUS | Status: AC
  Administered 2022-11-23: 5 10*6.[IU] via INTRAVENOUS
  Filled 2022-11-23: qty 5

## 2022-11-23 MED ORDER — OXYTOCIN BOLUS FROM INFUSION
333.0000 mL | Freq: Once | INTRAVENOUS | Status: AC
  Administered 2022-11-24: 333 mL via INTRAVENOUS

## 2022-11-23 MED ORDER — OXYTOCIN-SODIUM CHLORIDE 30-0.9 UT/500ML-% IV SOLN
1.0000 m[IU]/min | INTRAVENOUS | Status: DC
  Administered 2022-11-23: 2 m[IU]/min via INTRAVENOUS

## 2022-11-23 MED ORDER — LACTATED RINGERS IV SOLN: 500.0000 mL | INTRAVENOUS | Status: DC | PRN

## 2022-11-23 MED ORDER — MISOPROSTOL 25 MCG QUARTER TABLET: 25.0000 ug | ORAL_TABLET | Freq: Once | ORAL | Status: DC

## 2022-11-23 MED ORDER — MISOPROSTOL 50MCG HALF TABLET: 50.0000 ug | ORAL_TABLET | Freq: Once | ORAL | Status: DC

## 2022-11-23 MED ORDER — ONDANSETRON HCL 4 MG/2ML IJ SOLN: 4.0000 mg | Freq: Four times a day (QID) | INTRAMUSCULAR | Status: DC | PRN

## 2022-11-23 NOTE — Progress Notes (Signed)
   PRENATAL VISIT NOTE  Subjective:  Michele Larson is a 32 y.o. G3P1011 at [redacted]w[redacted]d being seen today for ongoing prenatal care.  She is currently monitored for the following issues for this high-risk pregnancy and has Supervision of other normal pregnancy, antepartum; History of shoulder dystocia in prior pregnancy, currently pregnant; Hepatitis B affecting pregnancy; Alpha thalassemia silent carrier; GDM (gestational diabetes mellitus); and Hx of maternal laceration, 3rd degree, currently pregnant, third trimester on their problem list.  Patient doing well with no acute concerns today. She reports headache, mild.   Contractions: Irregular. Vag. Bleeding: None.  Movement: Present. Denies leaking of fluid.   The following portions of the patient's history were reviewed and updated as appropriate: allergies, current medications, past family history, past medical history, past social history, past surgical history and problem list. Problem list updated.  Objective:   Vitals:   11/23/22 0846 11/23/22 0847  BP: (!) 131/90 (!) 135/96  Pulse: 80 79  Weight: 158 lb (71.7 kg)     Fetal Status: Fetal Heart Rate (bpm): 130 Fundal Height: 39 cm Movement: Present     General:  Alert, oriented and cooperative. Patient is in no acute distress.  Skin: Skin is warm and dry. No rash noted.   Cardiovascular: Normal heart rate noted  Respiratory: Normal respiratory effort, no problems with respiration noted  Abdomen: Soft, gravid, appropriate for gestational age.  Pain/Pressure: Present     Pelvic: Cervical exam deferred        Extremities: Normal range of motion.  Edema: Mild pitting, slight indentation  Mental Status:  Normal mood and affect. Normal behavior. Normal judgment and thought content.   Assessment and Plan:  Pregnancy: G3P1011 at [redacted]w[redacted]d  1. Supervision of other normal pregnancy, antepartum Pt to MAU for mild range BP and headache Possible admission for gestational hypertension  2. [redacted] weeks  gestation of pregnancy   3. Hepatitis B affecting pregnancy Pt will have Hep B DNA quantitative PCR drawn in MAU along with CMP  4. Diet controlled gestational diabetes mellitus (GDM) in third trimester FBS: 78-80 PPBS: 80-121  EFW 16%  5. Hx of maternal laceration, 3rd degree, currently pregnant, third trimester   6. History of shoulder dystocia in prior pregnancy, currently pregnant Delivery team will be aware regarding risk  7. Alpha thalassemia silent carrier   Term labor symptoms and general obstetric precautions including but not limited to vaginal bleeding, contractions, leaking of fluid and fetal movement were reviewed in detail with the patient.  Please refer to After Visit Summary for other counseling recommendations.   Return in about 1 week (around 11/30/2022) for [facility], in person. If undelivered  Mariel Aloe, MD Faculty Attending Center for [facility]

## 2022-11-23 NOTE — Progress Notes (Signed)
HROB, Pt has an Induction scheduled for 12/01/2022. BP is elevated today.

## 2022-11-23 NOTE — MAU Note (Signed)
..  Michele Larson is a 32 y.o. at [redacted]w[redacted]d here in MAU reporting: sent for elevated BP in the office. Also has headache and RUQ pain. Denies VB or LOF. +FM.   Pain score: RUQ- 7   HA- 9 Vitals:   11/23/22 1030  BP: (!) 139/103  Pulse: 87  Resp: 14  Temp: 98.3 F (36.8 C)  SpO2: 100%     FHT: unable to doppler due to attire, +FM Lab orders placed from triage:   UA

## 2022-11-23 NOTE — Progress Notes (Signed)
Patient ID: Michele Larson, female   DOB: 1990-08-10, 32 y.o.   MRN: [id number] LABOR NOTE [name] is a 32 y.o. G3P1011 at [redacted]w[redacted]d admitted for induction of labor due to diabetes mellitus A1DM and gestational hypertension   Just moved to L&D from MAU  Subjective: Mild headache, declines meds. Denies visual changes, ruq/epigastric pain, n/v.  Reports good fm, denies vb or lof. Wants postplacental Paragard  Objective: BP (!) 103/48   Pulse 86   Temp 98 F (36.7 C) (Oral)   Resp 16   Ht  (1.448 m)   LMP 03/03/2022 (Exact Date)   SpO2 98%   BMI 34.19 kg/m  No intake/output data recorded.  FHR baseline 120 bpm, Variability: moderate, Accelerations:present, Decelerations:  Absent Toco: irregular   SVE:   Dilation: 2.5 Effacement (%): 50 Station: -3 Exam by:: Joellyn Haff CNM Vtx   Labs: Lab Results  Component Value Date   WBC 9.9 11/23/2022   HGB 13.1 11/23/2022   HCT 41.2 11/23/2022   MCV 73.4 (L) 11/23/2022   PLT 242 11/23/2022    Assessment / Plan: IOL d/t GHTN and A1DM, just came up from holding in MAU. Mild headache-declines meds, otherwise asymptomatic.  Wants postplacental Paragard IUD, reviewed risks/benefits of IUD as well as postplacental vs interval placement at pp visit- prefers postplacental. Will start pitocin per protocol. H/O VAVD w/ shoulder dystocia and 3rd degree lac w/ 3255g infant. This infant 2399g/16% @ 35.5w, extrapolates to app 2815g  Labor: cervical ripening phase Fetal Wellbeing:  Category I Pain Control:  n/a Pre-eclampsia: asymptomatic, bp's stable, and labs stable I/D:  PCN for GBS+ Anticipated MOD: NSVB  Michele Larson CNM, WHNP-BC 11/23/2022, 10:05 PM

## 2022-11-23 NOTE — H&P (Addendum)
OBSTETRIC ADMISSION HISTORY AND PHYSICAL  Michele Michele Larson is a 32 y.o. female G3P1011 with IUP at [redacted]w[redacted]d by LMP presenting for elevated BP with reported headache and RUQ pain. She reports +FMs, No LOF, no VB.  She plans on breast and bottle feeding. She is considering Paragard for birth control. 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 -Hx shoulder dystocia in prior pregnancy  Past Medical History: Past Medical History:  Diagnosis Date   Headache    Hepatitis B affecting pregnancy     Past Surgical History: Past Surgical History:  Procedure Laterality Date   NO PAST SURGERIES      Obstetrical History: OB History     Gravida  3   Para  1   Term  1   Preterm  0   AB  1   Living  1      SAB  0   IAB  0   Ectopic  1   Multiple  0   Live Births  1           Social History Social History   Socioeconomic History   Marital status: Married    Spouse name: Not on file   Number of children: Not on file   Years of education: Not on file   Highest education level: Not on file  Occupational History   Not on file  Tobacco Use   Smoking status: Never   Smokeless tobacco: Never  Vaping Use   Vaping Use: Never used  Substance and Sexual Activity   Alcohol use: No   Drug use: No   Sexual activity: Not Currently    Birth control/protection: None  Other Topics Concern   Not on file  Social History Narrative   Not on file   Social Determinants of Health   Financial Resource Strain: Not on file  Food Insecurity: Not on file  Transportation Needs: Not on file  Physical Activity: Not on file  Stress: Not on file  Social Connections: Not on file    Family History: Family History  Problem  Relation Age of Onset   Asthma Neg Hx    Cancer Neg Hx    Diabetes Neg Hx    Heart disease Neg Hx    Hypertension Neg Hx     Allergies: No Known Allergies  Medications Prior to Admission  Medication Sig Dispense Refill Last Dose   Prenatal Vit-Fe Fumarate-FA (PRENATAL MULTIVITAMIN) TABS tablet Take 1 tablet by mouth daily at 12 noon.   11/22/2022   Accu-Chek Softclix Lancets lancets 1 each by Other route 4 (four) times daily. 100 each 12    Blood Glucose Monitoring Suppl (ACCU-CHEK GUIDE) w/Device KIT 1 Device by Does not apply route 4 (four) times daily. 1 kit 0    Blood Pressure Monitoring (BLOOD PRESSURE KIT) DEVI 1 Device by Does not apply route once a week. 1 each 0    glucose blood (ACCU-CHEK GUIDE) test strip Use to check blood sugars four times a day was instructed 50 each 12    SUMAtriptan (IMITREX) 50 MG tablet Take 1 tablet at start of migraine. Take another tablet in 2 hours if headache persists. Do not exceed 2 tablets in 1 day. (Patient  not taking: Reported on 05/24/2022) 10 tablet 5      Review of Systems   All systems reviewed and negative except as stated in HPI  Blood pressure (!) 139/103, pulse 87, temperature 98.3 F (36.8 C), temperature source Oral, resp. rate 14, height  (1.448 m), last menstrual period 03/03/2022, SpO2 100 %, unknown if currently breastfeeding. General appearance: alert and no distress Lungs: clear to auscultation bilaterally Heart: regular rate and rhythm Abdomen: soft, non-tender; bowel sounds normal Extremities: Homans sign is negative, no sign of DVT Fetal monitoringBaseline: 130 bpm, Variability: Good {> 6 bpm), and Accelerations: Reactive Uterine activity irregular     Prenatal labs: ABO, Rh: B/Positive/-- (11/27 1552) Antibody: Comment, See Final Results (11/27 1552) Rubella: 1.49 (11/27 1552) RPR: Non Reactive (02/12 0818)  HBsAg: Confirm. indicated (11/27 1552)  HIV: Non Reactive (02/12 0818)  GBS: Positive/-- (04/15  1115)  1 hr Glucola A1GDM Genetic screening  alpha thal silent carrier, Hg E carrier; LR Nips, neg AFP Anatomy US normal [redacted]w[redacted]d  Prenatal Transfer Tool  Maternal Diabetes: Yes:  Diabetes Type:  Diet controlled Genetic Screening: Abnormal:  Results: Other: alpha thal silent carrier, Hg E carrier. LR NIPS, neg AFP Maternal Ultrasounds/Referrals: Normal Fetal Ultrasounds or other Referrals:  None Maternal Substance Abuse:  No Significant Maternal Medications:  None Significant Maternal Lab Results:  Group B Strep positive and HBsAG positive Number of Prenatal Visits:greater than 3 verified prenatal visits Other Comments:  None  No results found for this or any previous visit (from the past 24 hour(s)).  Patient Active Problem List   Diagnosis Date Noted   Gestational hypertension 11/23/2022   Hx of maternal laceration, 3rd degree, currently pregnant, third trimester 11/15/2022   GDM (gestational diabetes mellitus) 09/14/2022   Hepatitis B affecting pregnancy 07/13/2022   Alpha thalassemia silent carrier 07/13/2022   History of shoulder dystocia in prior pregnancy, currently pregnant 06/28/2022   Supervision of other normal pregnancy, antepartum 05/24/2022    Assessment/Plan:  Michele Larson is a 32 y.o. G3P1011 at [redacted]w[redacted]d here for IOL for gHTN  #Labor:expectant management, likely induce with cytotec/FB #Pain: Per patient #FWB: Cat I #ID:  GBS Pos, PCN #MOF: both #MOC: considering Paragard #Circ:  N/A #gHTN: CTM. f/u PEC labs #Hepatitis B: newborn will need HBIG and HBV vaccine <12hrs after delivery, and 2 additional doses of vaccine within 6- to 26-month period #A1GDM: CBGs q4h  Vonna Drafts, MD  11/23/2022, 10:41 AM ___ GME ATTESTATION:  Evaluation and management procedures were performed by the [facility] Medicine Resident under my supervision. I was immediately available for direct supervision, assistance and direction throughout this encounter.  I also confirm that I have  verified the information documented in the resident's note, and that I have also personally reperformed the pertinent components of the physical exam and all of the medical decision making activities.  I have also made any necessary editorial changes.  Myrtie Hawk, DO OB Fellow, [facility], Center for [facility] 11/23/2022 4:20 PM

## 2022-11-24 ENCOUNTER — Encounter (HOSPITAL_BASED_OUTPATIENT_CLINIC_OR_DEPARTMENT_OTHER): Payer: Self-pay | Admitting: Anesthesiology

## 2022-11-24 ENCOUNTER — Inpatient Hospital Stay (HOSPITAL_COMMUNITY): Payer: Medicaid Other | Admitting: Anesthesiology

## 2022-11-24 DIAGNOSIS — Z3A37 37 weeks gestation of pregnancy: Secondary | ICD-10-CM

## 2022-11-24 DIAGNOSIS — O99820 Streptococcus B carrier state complicating pregnancy: Secondary | ICD-10-CM

## 2022-11-24 DIAGNOSIS — O134 Gestational [pregnancy-induced] hypertension without significant proteinuria, complicating childbirth: Secondary | ICD-10-CM

## 2022-11-24 DIAGNOSIS — O09823 Supervision of pregnancy with history of in utero procedure during previous pregnancy, third trimester: Secondary | ICD-10-CM

## 2022-11-24 DIAGNOSIS — O24420 Gestational diabetes mellitus in childbirth, diet controlled: Secondary | ICD-10-CM

## 2022-11-24 DIAGNOSIS — Z30014 Encounter for initial prescription of intrauterine contraceptive device: Secondary | ICD-10-CM

## 2022-11-24 LAB — COMPREHENSIVE METABOLIC PANEL
ALT: 13 U/L (ref 0–44)
AST: 20 U/L (ref 15–41)
Albumin: 2.5 g/dL — ABNORMAL LOW (ref 3.5–5.0)
Alkaline Phosphatase: 325 U/L — ABNORMAL HIGH (ref 38–126)
Anion gap: 11 (ref 5–15)
BUN: 7 mg/dL (ref 6–20)
CO2: 22 mmol/L (ref 22–32)
Calcium: 8.8 mg/dL — ABNORMAL LOW (ref 8.9–10.3)
Chloride: 102 mmol/L (ref 98–111)
Creatinine, Ser: 0.62 mg/dL (ref 0.44–1.00)
GFR, Estimated: >60 mL/min (ref >60)
Glucose, Bld: 92 mg/dL (ref 70–99)
Potassium: 4.2 mmol/L (ref 3.5–5.1)
Sodium: 135 mmol/L (ref 135–145)
Total Bilirubin: 0.7 mg/dL (ref 0.3–1.2)
Total Protein: 6.7 g/dL (ref 6.5–8.1)

## 2022-11-24 LAB — HEPATITIS B DNA, ULTRAQUANTITATIVE, PCR
HBV DNA SERPL PCR-ACNC: NOT DETECTED IU/mL
HBV DNA SERPL PCR-LOG IU: UNDETERMINED log10 IU/mL

## 2022-11-24 LAB — GLUCOSE, CAPILLARY
Glucose-Capillary: 91 mg/dL (ref 70–99)
Glucose-Capillary: 72 mg/dL (ref 70–99)
Glucose-Capillary: 81 mg/dL (ref 70–99)
Glucose-Capillary: 72 mg/dL (ref 70–99)
Glucose-Capillary: 156 mg/dL — ABNORMAL HIGH (ref 70–99)

## 2022-11-24 MED ORDER — FENTANYL-BUPIVACAINE-NACL 0.5-0.125-0.9 MG/250ML-% EP SOLN
12.0000 mL/h | EPIDURAL | Status: DC | PRN
  Administered 2022-11-24: 12 mL/h via EPIDURAL
  Filled 2022-11-24: qty 250

## 2022-11-24 MED ORDER — SODIUM CHLORIDE 0.9% FLUSH: 3.0000 mL | Freq: Two times a day (BID) | INTRAVENOUS | Status: DC

## 2022-11-24 MED ORDER — IBUPROFEN 600 MG PO TABS
600.0000 mg | ORAL_TABLET | Freq: Four times a day (QID) | ORAL | Status: DC
  Administered 2022-11-25 – 2022-11-26 (×6): 600 mg via ORAL
  Filled 2022-11-24 (×6): qty 1

## 2022-11-24 MED ORDER — LACTATED RINGERS IV SOLN: 500.0000 mL | Freq: Once | INTRAVENOUS | Status: DC

## 2022-11-24 MED ORDER — LIDOCAINE-EPINEPHRINE (PF) 2 %-1:200000 IJ SOLN
INTRAMUSCULAR | Status: DC | PRN
  Administered 2022-11-24: 3 mL via EPIDURAL

## 2022-11-24 MED ORDER — BUPIVACAINE HCL (PF) 0.25 % IJ SOLN
INTRAMUSCULAR | Status: DC | PRN
  Administered 2022-11-24 (×2): 3 mL via EPIDURAL

## 2022-11-24 MED ORDER — ONDANSETRON HCL 4 MG/2ML IJ SOLN: 4.0000 mg | INTRAMUSCULAR | Status: DC | PRN

## 2022-11-24 MED ORDER — ONDANSETRON HCL 4 MG PO TABS: 4.0000 mg | ORAL_TABLET | ORAL | Status: DC | PRN

## 2022-11-24 MED ORDER — SENNOSIDES-DOCUSATE SODIUM 8.6-50 MG PO TABS
2.0000 | ORAL_TABLET | ORAL | Status: DC
  Administered 2022-11-25 – 2022-11-26 (×2): 2 via ORAL
  Filled 2022-11-24 (×2): qty 2

## 2022-11-24 MED ORDER — EPHEDRINE 5 MG/ML INJ: 10.0000 mg | INTRAVENOUS | Status: DC | PRN

## 2022-11-24 MED ORDER — METHYLERGONOVINE MALEATE 0.2 MG/ML IJ SOLN: 0.2000 mg | Freq: Once | INTRAMUSCULAR | Status: DC

## 2022-11-24 MED ORDER — DIBUCAINE (PERIANAL) 1 % EX OINT: 1.0000 | TOPICAL_OINTMENT | CUTANEOUS | Status: DC | PRN

## 2022-11-24 MED ORDER — SIMETHICONE 80 MG PO CHEW: 80.0000 mg | CHEWABLE_TABLET | ORAL | Status: DC | PRN

## 2022-11-24 MED ORDER — ZOLPIDEM TARTRATE 5 MG PO TABS: 5.0000 mg | ORAL_TABLET | Freq: Every evening | ORAL | Status: DC | PRN

## 2022-11-24 MED ORDER — PRENATAL MULTIVITAMIN CH
1.0000 | ORAL_TABLET | Freq: Every day | ORAL | Status: DC
  Administered 2022-11-25 – 2022-11-26 (×2): 1 via ORAL
  Filled 2022-11-24 (×2): qty 1

## 2022-11-24 MED ORDER — TRANEXAMIC ACID-NACL 1000-0.7 MG/100ML-% IV SOLN
INTRAVENOUS | Status: AC
  Administered 2022-11-24: 1000 mg via INTRAVENOUS
  Filled 2022-11-24: qty 100

## 2022-11-24 MED ORDER — PHENYLEPHRINE 80 MCG/ML (10ML) SYRINGE FOR IV PUSH (FOR BLOOD PRESSURE SUPPORT): 80.0000 ug | PREFILLED_SYRINGE | INTRAVENOUS | Status: DC | PRN

## 2022-11-24 MED ORDER — ACETAMINOPHEN 325 MG PO TABS
650.0000 mg | ORAL_TABLET | ORAL | Status: DC | PRN
  Administered 2022-11-25: 650 mg via ORAL
  Filled 2022-11-24: qty 2

## 2022-11-24 MED ORDER — SODIUM CHLORIDE 0.9% FLUSH: 3.0000 mL | INTRAVENOUS | Status: DC | PRN

## 2022-11-24 MED ORDER — BENZOCAINE-MENTHOL 20-0.5 % EX AERO
1.0000 | INHALATION_SPRAY | CUTANEOUS | Status: DC | PRN
  Filled 2022-11-24: qty 56

## 2022-11-24 MED ORDER — WITCH HAZEL-GLYCERIN EX PADS: 1.0000 | MEDICATED_PAD | CUTANEOUS | Status: DC | PRN

## 2022-11-24 MED ORDER — DIPHENHYDRAMINE HCL 50 MG/ML IJ SOLN: 12.5000 mg | INTRAMUSCULAR | Status: DC | PRN

## 2022-11-24 MED ORDER — SODIUM CHLORIDE 0.9 % IV SOLN: 250.0000 mL | INTRAVENOUS | Status: DC | PRN

## 2022-11-24 MED ORDER — TRANEXAMIC ACID-NACL 1000-0.7 MG/100ML-% IV SOLN: 1000.0000 mg | Freq: Once | INTRAVENOUS | Status: AC

## 2022-11-24 MED ORDER — DIPHENHYDRAMINE HCL 25 MG PO CAPS: 25.0000 mg | ORAL_CAPSULE | Freq: Four times a day (QID) | ORAL | Status: DC | PRN

## 2022-11-24 MED ORDER — COCONUT OIL OIL: 1.0000 | TOPICAL_OIL | Status: DC | PRN

## 2022-11-24 MED ORDER — METHYLERGONOVINE MALEATE 0.2 MG/ML IJ SOLN
INTRAMUSCULAR | Status: AC
  Filled 2022-11-24: qty 1

## 2022-11-24 NOTE — Progress Notes (Signed)
Patient ID: Michele Larson, female   DOB: 09-29-1990, 32 y.o.   MRN: [id number] LABOR NOTE [name] is a 32 y.o. G3P1011 at [redacted]w[redacted]d admitted for induction of labor due to diabetes mellitus A1DM and gestational hypertension . Also has chronic HepB  Subjective: no complaints  Objective: BP 131/85   Pulse 63   Temp 98 F (36.7 C) (Oral)   Resp 17   Ht  (1.448 m)   LMP 03/03/2022 (Exact Date)   SpO2 98%   BMI 34.19 kg/m  No intake/output data recorded.  FHR baseline 120 bpm, Variability: moderate, Accelerations:present, Decelerations:  Absent Toco: q 2-5 mins   SVE:   Dilation: 3.5 Effacement (%): 50 Station: -3 Exam by:: Michele Gilford RN  Pitocin @ 12 mu/min  Labs: Lab Results  Component Value Date   WBC 9.9 11/23/2022   HGB 13.1 11/23/2022   HCT 41.2 11/23/2022   MCV 73.4 (L) 11/23/2022   PLT 242 11/23/2022   CBG (last 3)  Recent Labs    11/23/22 1824 11/23/22 2354 11/24/22 0343  GLUCAP 86 86 91     Assessment / Plan: IOL d/t GHTN, A1DM, also has chronic HepB, changing cx w/ pitocin. Continue to titrate per protocol to achieve adequate labor  Labor: early Fetal Wellbeing:  Category I Pain Control:  labor support without medications Pre-eclampsia: asymptomatic, bp's stable, and labs stable I/D:  PCN for GBS+ Anticipated MOD: NSVB  Michele Larson CNM, WHNP-BC 11/24/2022, 4:19 AM

## 2022-11-24 NOTE — Anesthesia Procedure Notes (Signed)
Epidural Patient location during procedure: OB Start time: 11/24/2022 11:52 AM End time: 11/24/2022 12:31 PM  Staffing Anesthesiologist: Val Eagle, MD Performed: anesthesiologist   Preanesthetic Checklist Completed: patient identified, IV checked, risks and benefits discussed, monitors and equipment checked, pre-op evaluation and timeout performed  Epidural Patient position: sitting Prep: DuraPrep Patient monitoring: heart rate, continuous pulse ox and blood pressure Approach: midline Location: L4-L5 Injection technique: LOR saline  Needle:  Needle type: Tuohy  Needle gauge: 17 G Needle length: 9 cm Needle insertion depth: 7 cm Catheter type: closed end flexible Catheter size: 19 Gauge Catheter at skin depth: 13 cm Test dose: negative and 2% lidocaine with Epi 1:200 K  Assessment Events: blood not aspirated, no cerebrospinal fluid, injection not painful, no injection resistance, no paresthesia and negative IV test  Additional Notes Attempts x 3 with repositioning. Surface landmarks difficult to assess with initial positioning and LOR unreliable. Attempt x 1 with new position.

## 2022-11-24 NOTE — Anesthesia Preprocedure Evaluation (Signed)
Anesthesia Evaluation  Patient identified by MRN, date of birth, ID band Patient awake    Reviewed: Allergy & Precautions, Patient's Chart, lab work & pertinent test results  History of Anesthesia Complications Negative for: history of anesthetic complications  Airway Mallampati: III  TM Distance: >3 FB Neck ROM: Full    Dental  (+) Dental Advisory Given   Pulmonary    breath sounds clear to auscultation       Cardiovascular hypertension,  Rhythm:Regular  gestational   Neuro/Psych  Headaches  negative psych ROS   GI/Hepatic ,,,(+) Hepatitis -, B  Endo/Other  diabetes, Gestational    Renal/GU      Musculoskeletal   Abdominal   Peds  Hematology Lab Results      Component                Value               Date                      WBC                      9.9                 11/23/2022                HGB                      13.1                11/23/2022                HCT                      41.2                11/23/2022                MCV                      73.4 (L)            11/23/2022                PLT                      242                 11/23/2022              Anesthesia Other Findings   Reproductive/Obstetrics                             Anesthesia Physical Anesthesia Plan  ASA: 2  Anesthesia Plan: Epidural   Post-op Pain Management:    Induction:   PONV Risk Score and Plan: 2 and Treatment may vary due to age or medical condition  Airway Management Planned: Natural Airway  Additional Equipment: None  Intra-op Plan:   Post-operative Plan:   Informed Consent: I have reviewed the patients History and Physical, chart, labs and discussed the procedure including the risks, benefits and alternatives for the proposed anesthesia with the patient or authorized representative who has indicated his/her understanding and acceptance.       Plan Discussed with:    Anesthesia Plan Comments:  Anesthesia Quick Evaluation  

## 2022-11-24 NOTE — Progress Notes (Signed)
H Hlinh Woolbright is a 32 y.o. G3P1011 at [redacted]w[redacted]d admitted for IOL in the setting of gHTN  Subjective: H Hlinh Bruning is comfortable with epidural. No concerns at this time.   Objective: BP 112/73   Pulse 93   Temp 98.1 F (36.7 C) (Oral)   Resp 16   Ht  (1.448 m)   LMP 03/03/2022 (Exact Date)   SpO2 97%   BMI 34.19 kg/m  No intake/output data recorded. No intake/output data recorded.  FHT: 130 bpm, moderate variability, +15x15 accels, occasional variable decel UC: Q 2-57mins SVE:   Dilation: 6 Effacement (%): 90 Station: -1 Exam by:: Orvan Falconer, RN  Labs: Lab Results  Component Value Date   WBC 9.9 11/23/2022   HGB 13.1 11/23/2022   HCT 41.2 11/23/2022   MCV 73.4 (L) 11/23/2022   PLT 242 11/23/2022    Assessment / Plan: H Hlinh Goeser is a 32 y.o. G3P1011 at [redacted]w[redacted]d admitted for IOL in the setting of gHTN.  Labor: Progressing. Continue Pitocin titration prn per unit policy gHTN: BP's stable. Asymptomatic. Labs normal. Continue to monitor Fetal Wellbeing:  Category II Pain Control:  epidural I/D:  GBS pos > PCN Anticipated MOD:  NSVD   Brand Males, CNM 11/24/2022, 4:47 PM

## 2022-11-24 NOTE — Progress Notes (Signed)
Patient ID: Michele Larson, female   DOB: January 20, 1991, 32 y.o.   MRN: [id number] LABOR NOTE [name] is a 32 y.o. G3P1011 at [redacted]w[redacted]d admitted for induction of labor due to gestational hypertension   Subjective: feeling contractions some, but not uncomfortable. Pt wants to speed things up  Objective: BP 121/81 (BP Location: Right Arm)   Pulse 84   Temp 98.1 F (36.7 C) (Oral)   Resp 18   Ht  (1.448 m)   LMP 03/03/2022 (Exact Date)   SpO2 98%   BMI 34.19 kg/m  No intake/output data recorded.  FHR baseline 125 bpm, Variability: moderate, Accelerations:present, Decelerations:  Absent Toco: q 2-4 mins   SVE:   3.5/50/-2 to -3 Pitocin @ 14 mu/min Attempted AROM, but cervix at an ant angle, did not get any fluid  Labs: Lab Results  Component Value Date   WBC 9.9 11/23/2022   HGB 13.1 11/23/2022   HCT 41.2 11/23/2022   MCV 73.4 (L) 11/23/2022   PLT 242 11/23/2022   CBG (last 3)  Recent Labs    11/23/22 1824 11/23/22 2354 11/24/22 0343  GLUCAP 86 86 91    Assessment / Plan: IOL d/t GHTN, A1DM, also has chronic HepB. Pt requested AROM, attempted but unsuccessful AROM. Continue increasing pit per protocol to achieve adequate labor  Labor: early Fetal Wellbeing:  Category I Pain Control:  labor support without medications Pre-eclampsia: asymptomatic, bp's stable, and labs stable I/D:  PCN for GBS+ Anticipated MOD: NSVB  Cheral Marker CNM, WHNP-BC 11/24/2022, 7:40 AM

## 2022-11-24 NOTE — Discharge Summary (Addendum)
Postpartum Discharge Summary     Patient Name: Michele Larson DOB: September 22, 1990 MRN: [id number]  Date of admission: 11/23/2022 Delivery date:11/24/2022  Delivering provider: Brand Males  Date of discharge: 11/26/2022  Admitting diagnosis: Diet controlled gestational diabetes mellitus (GDM) in third trimester [O24.410] Intrauterine pregnancy: [redacted]w[redacted]d     Secondary diagnosis:  Principal Problem:   Diet controlled gestational diabetes mellitus (GDM) in third trimester Active Problems:   History of shoulder dystocia in prior pregnancy, currently pregnant   Hepatitis B affecting pregnancy   GDM (gestational diabetes mellitus)   Gestational hypertension   SVD (spontaneous vaginal delivery)  Additional problems: None   Discharge diagnosis: Term Pregnancy Delivered, Gestational Hypertension, and GDM A1                                              Post partum procedures: postplacental IUD Augmentation: AROM and Pitocin Complications: None  Hospital course: Induction of Labor With Vaginal Delivery   32 y.o. yo G3P1011 at [redacted]w[redacted]d was admitted to the hospital 11/23/2022 for induction of labor.  Indication for induction: Gestational hypertension and A1 DM.  Patient had an labor course complicated by gestational hypertension and category II tracing Membrane Rupture Time/Date: 10:50 AM ,11/24/2022   Delivery Method:Vaginal, Spontaneous  Episiotomy: None  Lacerations:  1st degree  Details of delivery can be found in separate delivery note.  Patient had a postpartum course uncomplicated. Patient is discharged home 11/26/22.  Newborn Data: Birth date:11/24/2022  Birth time:8:02 PM  Gender:Female  Living status:Living  Apgars:8 ,9  Weight:2860 g   Magnesium Sulfate received: No BMZ received: No Rhophylac:N/A MMR:N/A T-DaP:Given prenatally Flu: No Transfusion:No  Physical exam  Vitals:   11/25/22 1056 11/25/22 1456 11/25/22 2050 11/26/22 0912  BP: 129/88 117/85 100/69 131/88   Pulse: 86 83 80 71  Resp: 18 16 16 16   Temp: 98 F (36.7 C) 98.3 F (36.8 C) 98 F (36.7 C) 97.9 F (36.6 C)  TempSrc: Oral Oral Oral Oral  SpO2: 99% 98% 99% 99%  Height:       General: alert Lochia: appropriate Uterine Fundus: firm Incision: N/A DVT Evaluation: No evidence of DVT seen on physical exam. Negative Homan's sign. No cords or calf tenderness. Labs: Lab Results  Component Value Date   WBC 16.7 (H) 11/25/2022   HGB 11.4 (L) 11/25/2022   HCT 35.9 (L) 11/25/2022   MCV 73.4 (L) 11/25/2022   PLT 177 11/25/2022      Latest Ref Rng & Units 11/24/2022   12:52 PM  CMP  Glucose 70 - 99 mg/dL 92   BUN 6 - 20 mg/dL 7   Creatinine 4.09 - 8.11 mg/dL 9.14   Sodium 782 - 956 mmol/L 135   Potassium 3.5 - 5.1 mmol/L 4.2   Chloride 98 - 111 mmol/L 102   CO2 22 - 32 mmol/L 22   Calcium 8.9 - 10.3 mg/dL 8.8   Total Protein 6.5 - 8.1 g/dL 6.7   Total Bilirubin 0.3 - 1.2 mg/dL 0.7   Alkaline Phos 38 - 126 U/L 325   AST 15 - 41 U/L 20   ALT 0 - 44 U/L 13    Edinburgh Score:    11/25/2022    7:40 AM  Edinburgh Postnatal Depression Scale Screening Tool  I have been able to laugh and see the funny side of  things. 2  I have looked forward with enjoyment to things. 1  I have blamed myself unnecessarily when things went wrong. 0  I have been anxious or worried for no good reason. 0  I have felt scared or panicky for no good reason. 0  Things have been getting on top of me. 1  I have been so unhappy that I have had difficulty sleeping. 2  I have felt sad or miserable. 0  I have been so unhappy that I have been crying. 0  The thought of harming myself has occurred to me. 0  Edinburgh Postnatal Depression Scale Total 6     After visit meds:  Allergies as of 11/26/2022   No Known Allergies      Medication List     STOP taking these medications    Accu-Chek Guide test strip Generic drug: glucose blood   Accu-Chek Guide w/Device Kit   Accu-Chek Softclix Lancets  lancets   Blood Pressure Kit Devi   prenatal multivitamin Tabs tablet   SUMAtriptan 50 MG tablet Commonly known as: Imitrex       TAKE these medications    acetaminophen 500 MG tablet Commonly known as: TYLENOL Take 2 tablets (1,000 mg total) by mouth 3 (three) times daily as needed (for pain scale < 4).   dibucaine 1 % Oint Commonly known as: NUPERCAINAL Place 1 Application rectally as needed for hemorrhoids.   furosemide 20 MG tablet Commonly known as: LASIX Take 1 tablet (20 mg total) by mouth daily. Start taking on: November 27, 2022   ibuprofen 600 MG tablet Commonly known as: ADVIL Take 1 tablet (600 mg total) by mouth every 6 (six) hours as needed for headache or mild pain.   NIFEdipine 30 MG 24 hr tablet Commonly known as: ADALAT CC Take 1 tablet (30 mg total) by mouth daily. Start taking on: November 27, 2022   witch hazel-glycerin pad Commonly known as: TUCKS Apply 1 Application topically as needed for hemorrhoids.         Discharge home in stable condition Infant Feeding: Bottle and Breast Infant Disposition:home with mother Discharge instruction: per After Visit Summary and Postpartum booklet. Activity: Advance as tolerated. Pelvic rest for 6 weeks.  Diet: routine diet Future Appointments: Future Appointments  Date Time Provider Department Center  01/11/2023  9:15 AM [facility] None  01/11/2023 10:15 AM Adam Phenix, MD [facility] None   Follow up Visit:  Message sent to [facility] on 4/24  Please schedule this patient for a In person postpartum visit in 6 weeks with the following provider: Any provider. Additional Postpartum F/U:2 hour GTT and BP check 1 week  High risk pregnancy complicated by: GDM and HTN Delivery mode:  Vaginal, Spontaneous  Anticipated Birth Control:  PP IUD placed   11/26/2022 Gilles Chiquito  Fellow Attestation  I saw and evaluated the patient, performing the key elements of the service.I  personally performed or  re-performed the history, physical exam, and medical decision making activities of this service and have verified that the service and findings are accurately documented in the resident's note. I developed the management plan that is described in the resident's note, and I agree with the content, with my edits above.    Derrel Nip, MD OB Fellow

## 2022-11-24 NOTE — Procedures (Signed)
  Post-Placental IUD Insertion Procedure Note  Patient identified, informed consent signed prior to delivery, signed copy in chart, time out was performed.    Vaginal, labial and perineal areas thoroughly inspected for lacerations. 1st degree laceration identified- hemostatic, not repaired prior to insertion of Paragard IUD.   - IUD grasped between sterile gloved fingers. Sterile lubrication applied to sterile gloved hand for ease of insertion. Fundus identified through abdominal wall using non-insertion hand. IUD inserted to fundus with bimanual technique. IUD carefully released at the fundus and insertion hand gently removed from vagina.   Strings trimmed to the level of the introitus. Patient tolerated procedure well.  Lot # [id number] Expiration Date 05/2028  Patient given post procedure instructions and IUD care card with expiration date.  Patient is asked to keep IUD strings tucked in her vagina until her postpartum follow up visit in 4-6 weeks. Patient advised to abstain from sexual intercourse and pulling on strings before her follow-up visit. Patient verbalized an understanding of the plan of care and agrees.

## 2022-11-24 NOTE — Progress Notes (Signed)
CBG 72  

## 2022-11-24 NOTE — Progress Notes (Signed)
Patient states she just had 8 oz of juice. Will recheck CBG in one hour

## 2022-11-24 NOTE — Progress Notes (Signed)
Michele Larson is a 32 y.o. G3P1011 at [redacted]w[redacted]d admitted for IOL in the setting of gHTN  Subjective: Called to room for prolonged deceleration  Objective: BP (!) 137/97   Pulse (!) 102   Temp 98.1 F (36.7 C) (Oral)   Resp 17   Ht  (1.448 m)   LMP 03/03/2022 (Exact Date)   SpO2 97%   BMI 34.19 kg/m  No intake/output data recorded. No intake/output data recorded.  FHT: 130 bpm, moderate variability, +15x15 accels, occasional variable decel UC: Q 2-2mins SVE:   Dilation: 10 Effacement (%): 90 Station: -1, 0 Exam by:: d Jairus Tonne cnm  Labs: Lab Results  Component Value Date   WBC 9.9 11/23/2022   HGB 13.1 11/23/2022   HCT 41.2 11/23/2022   MCV 73.4 (L) 11/23/2022   PLT 242 11/23/2022    Assessment / Plan: Michele Hlinh Muccio is a 32 y.o. G3P1011 at [redacted]w[redacted]d admitted for IOL in the setting of gHTN.  Labor: Patient complete. Attempted to push but no movement in fetal station. Labor down. Patient placed into flying cowgirl position with peanut. Plan to reassess in 30 mins.  gHTN: BP's stable. Asymptomatic. Labs normal. Continue to monitor Fetal Wellbeing:  Category II Pain Control:  epidural I/D:  GBS pos > PCN Anticipated MOD:  NSVD   Brand Males, CNM 11/24/2022, 5:58 PM

## 2022-11-24 NOTE — Anesthesia Preprocedure Evaluation (Deleted)
Anesthesia Evaluation    Airway        Dental   Pulmonary           Cardiovascular      Neuro/Psych    GI/Hepatic   Endo/Other    Renal/GU      Musculoskeletal   Abdominal   Peds  Hematology   Anesthesia Other Findings   Reproductive/Obstetrics                              Anesthesia Physical Anesthesia Plan Anesthesia Quick Evaluation  

## 2022-11-24 NOTE — Progress Notes (Signed)
Michele Larson is a 32 y.o. G3P1011 at [redacted]w[redacted]d admitted for IOL in the setting of gHTN  Subjective: Michele Larson is doing well. She reports minimal cramping. Significant other is present at bedside. No questions or concerns at this time.  Objective: BP (!) 132/97 (BP Location: Right Arm)   Pulse 80   Temp 98.3 F (36.8 C) (Oral)   Resp 16   Ht  (1.448 m)   LMP 03/03/2022 (Exact Date)   SpO2 98%   BMI 34.19 kg/m  No intake/output data recorded. No intake/output data recorded.  FHT: 140 bpm, moderate variability, +15x15 accels, no decels UC: Q 2-74mins SVE:   Dilation: 3.5 Effacement (%): 60 Station: -2 Exam by:: Camelia Eng  Labs: Lab Results  Component Value Date   WBC 9.9 11/23/2022   HGB 13.1 11/23/2022   HCT 41.2 11/23/2022   MCV 73.4 (L) 11/23/2022   PLT 242 11/23/2022    Assessment / Plan: Michele Larson is a 32 y.o. G3P1011 at [redacted]w[redacted]d admitted for IOL in the setting of gHTN.  Labor: Patient currently on 28mu/min of Pitocin. Discussed R/B/A to AROM. Patient gave verbal consent to AROM. AROM with small amount of pink-tinged fluid.  gHTN: BP's stable. Asymptomatic. Labs normal. Continue to monitor Fetal Wellbeing:  Category I Pain Control:  prn per patient request I/D:  GBS pos > PCN Anticipated MOD:  NSVD   Brand Males, CNM 11/24/2022, 10:58 AM

## 2022-11-24 NOTE — Progress Notes (Signed)
CBG 72, value not merging in chart

## 2022-11-25 ENCOUNTER — Encounter (HOSPITAL_COMMUNITY): Payer: Self-pay | Admitting: Obstetrics and Gynecology

## 2022-11-25 LAB — CBC
HCT: 35.9 % — ABNORMAL LOW (ref 36.0–46.0)
Hemoglobin: 11.4 g/dL — ABNORMAL LOW (ref 12.0–15.0)
MCH: 23.3 pg — ABNORMAL LOW (ref 26.0–34.0)
MCHC: 31.8 g/dL (ref 30.0–36.0)
MCV: 73.4 fL — ABNORMAL LOW (ref 80.0–100.0)
Platelets: 177 10*3/uL (ref 150–400)
RBC: 4.89 MIL/uL (ref 3.87–5.11)
RDW: 13.9 % (ref 11.5–15.5)
WBC: 16.7 10*3/uL — ABNORMAL HIGH (ref 4.0–10.5)
nRBC: 0 % (ref 0.0–0.2)

## 2022-11-25 MED ORDER — NIFEDIPINE ER OSMOTIC RELEASE 30 MG PO TB24
30.0000 mg | ORAL_TABLET | Freq: Every day | ORAL | Status: DC
  Administered 2022-11-25 – 2022-11-26 (×2): 30 mg via ORAL
  Filled 2022-11-25 (×2): qty 1

## 2022-11-25 MED ORDER — FUROSEMIDE 20 MG PO TABS
20.0000 mg | ORAL_TABLET | Freq: Every day | ORAL | Status: DC
  Administered 2022-11-25 – 2022-11-26 (×2): 20 mg via ORAL
  Filled 2022-11-25 (×2): qty 1

## 2022-11-25 NOTE — Progress Notes (Signed)
POSTPARTUM PROGRESS NOTE  Post Partum Day 1  Subjective:  Michele Larson is a 32 y.o. [id number] s/p SVD at [redacted]w[redacted]d.  She reports she is doing well. No acute events overnight. She denies any problems with ambulating, voiding or po intake. Denies nausea or vomiting.  Pain is well controlled.  Lochia is appropriate.  Objective: Blood pressure 112/79, pulse 85, temperature 97.9 F (36.6 C), temperature source Oral, resp. rate 20, height  (1.448 m), last menstrual period 03/03/2022, SpO2 98 %, unknown if currently breastfeeding.  Physical Exam:  General: alert, cooperative and no distress Chest: no respiratory distress Heart:regular rate, distal pulses intact Uterine Fundus: firm, appropriately tender DVT Evaluation: No calf swelling or tenderness Extremities: no LE edema Skin: warm, dry  Recent Labs    11/23/22 1101 11/25/22 0608  HGB 13.1 11.4*  HCT 41.2 35.9*    Assessment/Plan: Michele Hlinh Strickland is a 32 y.o. [id number] s/p SVD at [redacted]w[redacted]d   PPD#1 - Doing well  Routine postpartum care  gHTN with elevated post delivery BP -Add Procardia 30 mg qd  -Add Lasix 20 mg qd X 5 days   Contraception: PP Paragard  Feeding: Breast and bottle  Dispo: Plan for discharge tomorrow.   LOS: 2 days   Gilles Chiquito PGY-3 11/25/2022, 8:28 AM

## 2022-11-25 NOTE — Lactation Note (Signed)
This note was copied from a baby's chart. Lactation Consultation Note  Patient Name: Girl Shantai Tiedeman [id number] Date: 11/25/2022 Age:31 hours  Reason for consult: Initial assessment;Early term 37-38.6wks;Other (Comment) (Hep B positive)  Mother speaks [place] and denies need for an interpreter. Baby Girl " Zollie Scale" is asleep and mother reports she just breast fed her formula after breastfeeding for 10 minutes. Mother has not recorded breastfeeding because she did not feel that she had any milk in her breast yet. Reassured/ educated that she has colostrum and that it is very healthy for her newborn. Mother was able to see colostrum with hand expression. Encouraged to allow baby to breast feed before offering formula, feeding baby with cues with goal of breastfeeding 8-12 times/24 to promote milk production. Encouraged to ask for assistance with breastfeeding as needed.  Discussed Hep B and breastfeeding. Instructed mother that breastfeeding is safe, however, she should not breastfeed if her nipples or skin surrounding her nipples are cracked or bleeding. Mother verbalized understanding, Currently mother's nipples are healthy and skin is intact .  Mother requested a manual pump for home use. Given with instructions.  Mom made aware of O/P services, breastfeeding support groups, and our phone # for post-discharge questions.     Maternal Data Has patient been taught Hand Expression?: Yes Does the patient have breastfeeding experience prior to this delivery?: Yes How long did the patient breastfeed?: 1 week (8 years ago)  Feeding Nipple Type: Slow - flow     Lactation Tools Discussed/Used  Given manual breast pump  Interventions Interventions: Hand express;Education;Hand pump (24 mm flange appropiate fit)  Discharge Pump: Manual  Consult Status Consult Status: Follow-up Date: 11/26/22 Follow-up type: In-patient    Christella Hartigan M 11/25/2022, 3:36 PM

## 2022-11-25 NOTE — Anesthesia Postprocedure Evaluation (Signed)
Anesthesia Post Note  Patient: Michele Larson  Procedure(s) Performed: AN AD HOC LABOR EPIDURAL     Patient location during evaluation: Mother Baby Anesthesia Type: Epidural Level of consciousness: awake, oriented and awake and alert Pain management: pain level controlled Vital Signs Assessment: post-procedure vital signs reviewed and stable Respiratory status: spontaneous breathing, respiratory function stable and nonlabored ventilation Postop Assessment: no headache, adequate PO intake, able to ambulate, patient able to bend at knees and no apparent nausea or vomiting Anesthetic complications: no   No notable events documented.  Last Vitals:  Vitals:   11/25/22 0655 11/25/22 1056  BP: 112/79 129/88  Pulse: 85 86  Resp: 20 18  Temp: 36.6 C 36.7 C  SpO2: 98% 99%    Last Pain:  Vitals:   11/25/22 1056  TempSrc: Oral  PainSc:    Pain Goal: Patients Stated Pain Goal: 0 (11/23/22 2154)                 [name]

## 2022-11-26 ENCOUNTER — Other Ambulatory Visit (HOSPITAL_COMMUNITY): Payer: Self-pay

## 2022-11-26 MED ORDER — DIBUCAINE (PERIANAL) 1 % EX OINT: 1.0000 | TOPICAL_OINTMENT | CUTANEOUS | 1 refills | Status: AC | PRN

## 2022-11-26 MED ORDER — SENNOSIDES-DOCUSATE SODIUM 8.6-50 MG PO TABS: 2.0000 | ORAL_TABLET | ORAL | 0 refills | Status: AC

## 2022-11-26 MED ORDER — NIFEDIPINE ER 30 MG PO TB24: 30.0000 mg | ORAL_TABLET | Freq: Every day | ORAL | 0 refills | Status: DC

## 2022-11-26 MED ORDER — DIBUCAINE (PERIANAL) 1 % EX OINT: 1.0000 | TOPICAL_OINTMENT | CUTANEOUS | 0 refills | Status: DC | PRN

## 2022-11-26 MED ORDER — ACETAMINOPHEN 500 MG PO TABS: 1000.0000 mg | ORAL_TABLET | Freq: Three times a day (TID) | ORAL | 0 refills | Status: DC | PRN

## 2022-11-26 MED ORDER — ACETAMINOPHEN 500 MG PO TABS: 1000.0000 mg | ORAL_TABLET | Freq: Three times a day (TID) | ORAL | 1 refills | Status: DC | PRN

## 2022-11-26 MED ORDER — WITCH HAZEL-GLYCERIN EX PADS: 1.0000 | MEDICATED_PAD | CUTANEOUS | 0 refills | Status: AC | PRN

## 2022-11-26 MED ORDER — NIFEDIPINE ER 30 MG PO TB24: 30.0000 mg | ORAL_TABLET | Freq: Every day | ORAL | 1 refills | Status: DC

## 2022-11-26 MED ORDER — WITCH HAZEL-GLYCERIN EX PADS: 1.0000 | MEDICATED_PAD | CUTANEOUS | 12 refills | Status: DC | PRN

## 2022-11-26 MED ORDER — ACETAMINOPHEN 325 MG PO TABS: 650.0000 mg | ORAL_TABLET | Freq: Four times a day (QID) | ORAL | 0 refills | Status: AC | PRN

## 2022-11-26 MED ORDER — IBUPROFEN 800 MG PO TABS: 800.0000 mg | ORAL_TABLET | Freq: Three times a day (TID) | ORAL | 0 refills | Status: AC | PRN

## 2022-11-26 MED ORDER — DIBUCAINE (PERIANAL) 1 % EX OINT: 1.0000 | TOPICAL_OINTMENT | CUTANEOUS | 1 refills | Status: DC | PRN

## 2022-11-26 MED ORDER — IBUPROFEN 600 MG PO TABS: 600.0000 mg | ORAL_TABLET | Freq: Four times a day (QID) | ORAL | 1 refills | Status: DC | PRN

## 2022-11-26 MED ORDER — FUROSEMIDE 20 MG PO TABS: 20.0000 mg | ORAL_TABLET | Freq: Every day | ORAL | 0 refills | Status: AC

## 2022-11-26 MED ORDER — FUROSEMIDE 20 MG PO TABS: 20.0000 mg | ORAL_TABLET | Freq: Every day | ORAL | 0 refills | Status: DC

## 2022-11-26 MED ORDER — IBUPROFEN 600 MG PO TABS: 600.0000 mg | ORAL_TABLET | Freq: Four times a day (QID) | ORAL | 0 refills | Status: DC | PRN

## 2022-11-26 MED ORDER — SENNOSIDES-DOCUSATE SODIUM 8.6-50 MG PO TABS
2.0000 | ORAL_TABLET | ORAL | 0 refills | Status: DC
  Filled 2022-11-26: qty 30, 15d supply, fill #0

## 2022-11-26 MED ORDER — BENZOCAINE-MENTHOL 20-0.5 % EX AERO
1.0000 | INHALATION_SPRAY | CUTANEOUS | 0 refills | Status: DC | PRN
  Filled 2022-11-26: qty 78, fill #0

## 2022-11-26 MED ORDER — BENZOCAINE-MENTHOL 20-0.5 % EX AERO: 1.0000 | INHALATION_SPRAY | CUTANEOUS | 0 refills | Status: AC | PRN

## 2022-11-26 MED ORDER — NIFEDIPINE ER 30 MG PO TB24: 60.0000 mg | ORAL_TABLET | Freq: Every day | ORAL | 0 refills | Status: AC

## 2022-11-26 MED ORDER — WITCH HAZEL-GLYCERIN EX PADS: 1.0000 | MEDICATED_PAD | CUTANEOUS | 0 refills | Status: DC | PRN

## 2022-11-26 NOTE — Lactation Note (Signed)
This note was copied from a baby's chart. Lactation Consultation Note  Patient Name: Michele Larson RUEAV'W Date: 11/26/2022 Age:31 hours  Reason for consult: Follow-up assessment;Early term 37-38.6wks;Other (Comment) (Hep B)  P2, GA [redacted]w[redacted]d, 2% weight loss, mother Hep B  Mother reports baby is breastfeeding well. Latch was not observed. Mother states she has consistently been  breastfeeding for 15-20 minutes prior to formula feeding. She feels that are breast are filling. Mother states baby is not latching as well to the right breast and wants to know if she can pump that breast if baby never latches. Suggested ideas for latching to right, offered to return for latch assist if needed and encouraged to keep putting baby to breast to avoid having to pump.   Mother denies any nipple soreness or breakdown. She wants to transition to breastfeeding once her milk is in. Mom made aware of O/P services, breastfeeding support groups,  and our phone # for post-discharge questions. Encouraged to get OP LC help after discharge if she is unable to latch baby to both breast.    Feeding Mother's Current Feeding Choice: Breast Milk and Formula Nipple Type: Slow - flow  Interventions Interventions: Education  Consult Status Consult Status: Complete Date: 11/26/22    Omar Person 11/26/2022, 9:44 AM

## 2022-11-26 NOTE — Progress Notes (Signed)
  Babyscripts hypertension order placed per MD order. Set patient up with app and gave patient blood pressure cuff. Patient entered blood pressure into app and blood pressure is visible in episodes of care. Earl Gala, Linda [facility] [place]

## 2022-12-01 ENCOUNTER — Inpatient Hospital Stay (HOSPITAL_COMMUNITY): Admission: RE | Admit: 2022-12-01 | Payer: Medicaid Other | Source: Home / Self Care | Admitting: Family Medicine

## 2022-12-01 ENCOUNTER — Inpatient Hospital Stay (HOSPITAL_COMMUNITY): Payer: Medicaid Other

## 2022-12-03 ENCOUNTER — Inpatient Hospital Stay (HOSPITAL_COMMUNITY)
Admission: AD | Admit: 2022-12-03 | Discharge: 2022-12-03 | Disposition: A | Discharge status: Home / Self Care | Payer: Medicaid Other | Attending: Obstetrics & Gynecology | Admitting: Obstetrics & Gynecology

## 2022-12-03 ENCOUNTER — Encounter (HOSPITAL_COMMUNITY): Payer: Self-pay | Admitting: Obstetrics & Gynecology

## 2022-12-03 ENCOUNTER — Telehealth: Payer: Self-pay

## 2022-12-03 DIAGNOSIS — O9089 Other complications of the puerperium, not elsewhere classified: Secondary | ICD-10-CM | POA: Insufficient documentation

## 2022-12-03 DIAGNOSIS — R519 Headache, unspecified: Secondary | ICD-10-CM | POA: Diagnosis not present

## 2022-12-03 DIAGNOSIS — Z79899 Other long term (current) drug therapy: Secondary | ICD-10-CM | POA: Diagnosis not present

## 2022-12-03 LAB — COMPREHENSIVE METABOLIC PANEL
ALT: 27 U/L (ref 0–44)
AST: 22 U/L (ref 15–41)
Albumin: 3.1 g/dL — ABNORMAL LOW (ref 3.5–5.0)
Alkaline Phosphatase: 133 U/L — ABNORMAL HIGH (ref 38–126)
Anion gap: 11 (ref 5–15)
BUN: 10 mg/dL (ref 6–20)
CO2: 23 mmol/L (ref 22–32)
Calcium: 8.8 mg/dL — ABNORMAL LOW (ref 8.9–10.3)
Chloride: 103 mmol/L (ref 98–111)
Creatinine, Ser: 0.72 mg/dL (ref 0.44–1.00)
GFR, Estimated: >60 mL/min (ref >60)
Glucose, Bld: 89 mg/dL (ref 70–99)
Potassium: 4 mmol/L (ref 3.5–5.1)
Sodium: 137 mmol/L (ref 135–145)
Total Bilirubin: 0.2 mg/dL — ABNORMAL LOW (ref 0.3–1.2)
Total Protein: 7.5 g/dL (ref 6.5–8.1)

## 2022-12-03 LAB — CBC
HCT: 43.3 % (ref 36.0–46.0)
Hemoglobin: 13.7 g/dL (ref 12.0–15.0)
MCH: 23.1 pg — ABNORMAL LOW (ref 26.0–34.0)
MCHC: 31.6 g/dL (ref 30.0–36.0)
MCV: 72.9 fL — ABNORMAL LOW (ref 80.0–100.0)
Platelets: 414 10*3/uL — ABNORMAL HIGH (ref 150–400)
RBC: 5.94 MIL/uL — ABNORMAL HIGH (ref 3.87–5.11)
RDW: 14.4 % (ref 11.5–15.5)
WBC: 7.6 10*3/uL (ref 4.0–10.5)
nRBC: 0 % (ref 0.0–0.2)

## 2022-12-03 MED ORDER — ACETAMINOPHEN-CAFFEINE 500-65 MG PO TABS
2.0000 | ORAL_TABLET | Freq: Once | ORAL | Status: AC
  Administered 2022-12-03: 2 via ORAL
  Filled 2022-12-03: qty 2

## 2022-12-03 MED ORDER — EXCEDRIN TENSION HEADACHE 500-65 MG PO TABS: 2.0000 | ORAL_TABLET | Freq: Four times a day (QID) | ORAL | 0 refills | Status: AC | PRN

## 2022-12-03 NOTE — Telephone Encounter (Signed)
Called patient via [place] interpreter regarding elevate BP reported in Baby rx. BP is 146/104 with persistent headaches. Patient advised to go to MAU for evaluation. Patient verbalized understanding.

## 2022-12-03 NOTE — MAU Provider Note (Signed)
History     CSN: [id number]  Arrival date and time: 12/03/22 1240   None     No chief complaint on file.  HPI [name] is a 32 y.o. [id number] PPD#9 who presents to MAU for high blood pressure and headache. Pregnancy was complicated by gHTN. Was discharged home on Procardia and Lasix. She reports a BP of 140/92 today. She also reports a headache that she currently rates 7-8/10. Headache is generalized. She has a history of migraines however headache feels different that her typical migraines. She has not taken anything to relieve her headache today. She denies vision changes or RUQ/epigastric pain. She reports a normal diet. She has not been sleeping much during the night due to baby breastfeeding more frequently. She reports she gets a "couple of hours" of sleep. Her husband is supportive. She also has family that live close by.    OB History     Gravida  3   Para  2   Term  2   Preterm  0   AB  1   Living  2      SAB  0   IAB  0   Ectopic  1   Multiple  0   Live Births  2           Past Medical History:  Diagnosis Date   Headache    Hepatitis B affecting pregnancy     Past Surgical History:  Procedure Laterality Date   NO PAST SURGERIES      Family History  Problem Relation Age of Onset   Asthma Neg Hx    Cancer Neg Hx    Diabetes Neg Hx    Heart disease Neg Hx    Hypertension Neg Hx     Social History   Tobacco Use   Smoking status: Never   Smokeless tobacco: Never  Vaping Use   Vaping Use: Never used  Substance Use Topics   Alcohol use: No   Drug use: No    Allergies: No Known Allergies  Medications Prior to Admission  Medication Sig Dispense Refill Last Dose   acetaminophen (TYLENOL) 325 MG tablet Take 2 tablets (650 mg total) by mouth every 6 (six) hours as needed. 30 tablet 0    benzocaine-Menthol (DERMOPLAST) 20-0.5 % AERO Apply 1 Application topically as needed for irritation (perineal discomfort). 78 g 0    dibucaine  (NUPERCAINAL) 1 % OINT Place 1 Application rectally as needed for hemorrhoids. 28 g 1    furosemide (LASIX) 20 MG tablet Take 1 tablet (20 mg total) by mouth daily for 3 days. 3 tablet 0    ibuprofen (ADVIL) 800 MG tablet Take 1 tablet (800 mg total) by mouth every 8 (eight) hours as needed. 30 tablet 0    NIFEdipine (ADALAT CC) 30 MG 24 hr tablet Take 2 tablets (60 mg total) by mouth daily. 30 tablet 0    senna-docusate (SENOKOT-S) 8.6-50 MG tablet Take 2 tablets by mouth daily. 30 tablet 0    witch hazel-glycerin (TUCKS) pad Apply 1 Application topically as needed for hemorrhoids. 40 each 0    Review of Systems  Neurological:  Positive for headaches.  All other systems reviewed and are negative.  Physical Exam  Patient Vitals for the past 24 hrs:  BP Temp Temp src Pulse Resp SpO2 Height Weight  12/03/22 1501 115/84 -- -- 76 -- -- -- --  12/03/22 1446 116/81 -- -- 74 -- -- -- --  12/03/22 1431 115/81 -- -- 80 -- -- -- --  12/03/22 1416 121/79 -- -- 88 -- -- -- --  12/03/22 1400 121/79 -- -- 88 -- -- -- --  12/03/22 1343 138/81 98.1 F (36.7 C) Oral 95 17 98 % 4\' 9"  (1.448 m) 62.1 kg    Physical Exam Vitals and nursing note reviewed.  Constitutional:      General: She is not in acute distress. Eyes:     Extraocular Movements: Extraocular movements intact.     Pupils: Pupils are equal, round, and reactive to light.  Cardiovascular:     Rate and Rhythm: Normal rate.  Pulmonary:     Effort: Pulmonary effort is normal. No respiratory distress.  Musculoskeletal:        General: Normal range of motion.     Cervical back: Normal range of motion.  Skin:    General: Skin is warm and dry.  Neurological:     General: No focal deficit present.     Mental Status: She is alert and oriented to person, place, and time.     Cranial Nerves: No cranial nerve deficit.  Psychiatric:        Mood and Affect: Mood normal.        Behavior: Behavior normal.    Results for orders placed or  performed during the hospital encounter of 12/03/22 (from the past 24 hour(s))  CBC     Status: Abnormal   Collection Time: 12/03/22  2:37 PM  Result Value Ref Range   WBC 7.6 4.0 - 10.5 K/uL   RBC 5.94 (H) 3.87 - 5.11 MIL/uL   Hemoglobin 13.7 12.0 - 15.0 g/dL   HCT 16.1 09.6 - 04.5 %   MCV 72.9 (L) 80.0 - 100.0 fL   MCH 23.1 (L) 26.0 - 34.0 pg   MCHC 31.6 30.0 - 36.0 g/dL   RDW 40.9 81.1 - 91.4 %   Platelets 414 (H) 150 - 400 K/uL   nRBC 0.0 0.0 - 0.2 %  Comprehensive metabolic panel     Status: Abnormal   Collection Time: 12/03/22  2:37 PM  Result Value Ref Range   Sodium 137 135 - 145 mmol/L   Potassium 4.0 3.5 - 5.1 mmol/L   Chloride 103 98 - 111 mmol/L   CO2 23 22 - 32 mmol/L   Glucose, Bld 89 70 - 99 mg/dL   BUN 10 6 - 20 mg/dL   Creatinine, Ser 7.82 0.44 - 1.00 mg/dL   Calcium 8.8 (L) 8.9 - 10.3 mg/dL   Total Protein 7.5 6.5 - 8.1 g/dL   Albumin 3.1 (L) 3.5 - 5.0 g/dL   AST 22 15 - 41 U/L   ALT 27 0 - 44 U/L   Alkaline Phosphatase 133 (H) 38 - 126 U/L   Total Bilirubin 0.2 (L) 0.3 - 1.2 mg/dL   GFR, Estimated >95 >62 mL/min   Anion gap 11 5 - 15   MAU Course  Procedures  MDM CBC, CMP Serial BP's Excedrin Tension  BP's all normotensive. Labs unremarkable. Headache resolved with medication. I have a low suspicion for postpartum pre-eclampsia at this time as BPs and labs are normal and headache resolved.   Assessment and Plan   1. Postpartum headache    - Discharge home in stable condition - Rx for Excedrin Tension sent to pharmacy - Reviewed importance of adequate nutrition, hydration, and sleep. Encouraged patient to sleep when the baby sleeps and to allow family members to help when  they offer help - Keep appointment as scheduled. Return to MAU as needed for new/worsening symptoms   Brand Males, CNM 12/03/2022, 4:24 PM

## 2022-12-03 NOTE — MAU Note (Signed)
Michele Larson is a 32 y.o. at  in MAU reporting: vag del 4/24. Has high BP.  Checked at home, has APP that connects, office noted and called her, instructed to come in .  Has a HA (took Ibuprofen yesterday- did not have HA yesterday, hx of migraines), denies visual changes, epigastric pain or increase in swelling.  No hx of HTN.  Onset of complaint: today  140/92 @1101 , 146/104 @1026 , had been 130's/80-90's earlier this morning Pain score: 7 Vitals:   12/03/22 1343  BP: 138/81  Pulse: 95  Resp: 17  Temp: 98.1 F (36.7 C)  SpO2: 98%      Lab orders placed from triage:

## 2022-12-04 ENCOUNTER — Telehealth (HOSPITAL_COMMUNITY): Payer: Self-pay

## 2022-12-04 NOTE — Telephone Encounter (Signed)
Patient reports feeling good. Patient declines questions/concerns about her health and healing.  Patient reports that baby is doing well. Eating, peeing/pooping, and gaining weight well. Baby sleeps in a crib. RN reviewed ABC's of safe sleep with patient. Patient declines any questions or concerns about baby.  EPDS score is 3.  Suann Larry [facility] [employer]   12/04/22,1616

## 2023-01-11 ENCOUNTER — Encounter: Payer: Self-pay | Admitting: Obstetrics & Gynecology

## 2023-01-11 ENCOUNTER — Ambulatory Visit (INDEPENDENT_AMBULATORY_CARE_PROVIDER_SITE_OTHER): Payer: Medicaid Other | Admitting: Obstetrics & Gynecology

## 2023-01-11 ENCOUNTER — Other Ambulatory Visit: Payer: Medicaid Other

## 2023-01-11 DIAGNOSIS — Z30431 Encounter for routine checking of intrauterine contraceptive device: Secondary | ICD-10-CM | POA: Diagnosis not present

## 2023-01-11 DIAGNOSIS — O24410 Gestational diabetes mellitus in pregnancy, diet controlled: Secondary | ICD-10-CM

## 2023-01-11 NOTE — Progress Notes (Signed)
Post Partum Visit Note  Michele Larson is a 32 y.o. [id number] female who presents for a postpartum visit. She is 6 week postpartum following a normal spontaneous vaginal delivery.  I have fully reviewed the prenatal and intrapartum course. The delivery was at [redacted]w[redacted]d gestational weeks.  Anesthesia: epidural. Postpartum course has been unremarkable. Baby is doing well. Baby is feeding by both breast and bottle - Similac . Bleeding no bleeding. Bowel function is normal. Bladder function is normal. Patient is sexually active. Contraception method is none. Postpartum depression screening: negative.   The pregnancy intention screening data noted above was reviewed. Potential methods of contraception were discussed. The patient elected to proceed with No data recorded.   Edinburgh Postnatal Depression Scale - 01/11/23 0928       Edinburgh Postnatal Depression Scale:  In the Past 7 Days   I have been able to laugh and see the funny side of things. 0    I have looked forward with enjoyment to things. 0    I have blamed myself unnecessarily when things went wrong. 0    I have been anxious or worried for no good reason. 0    I have felt scared or panicky for no good reason. 0    Things have been getting on top of me. 0    I have been so unhappy that I have had difficulty sleeping. 0    I have felt sad or miserable. 0    I have been so unhappy that I have been crying. 1    The thought of harming myself has occurred to me. 0    Edinburgh Postnatal Depression Scale Total 1             Health Maintenance Due  Topic Date Due   COVID-19 Vaccine (1) Never done    The following portions of the patient's history were reviewed and updated as appropriate: allergies, current medications, past family history, past medical history, past social history, past surgical history, and problem list.  Review of Systems Pertinent items are noted in HPI.  Objective:  BP 125/85   Pulse 67   Wt 137 lb 9.6 oz  (62.4 kg)   LMP 03/03/2022 (Exact Date)   BMI 29.78 kg/m    General:  alert, cooperative, and no distress   Breasts:  not indicated  Lungs:   Heart:  regular rate and rhythm  Abdomen: soft, non-tender; bowel sounds normal; no masses,  no organomegaly   Wound   GU exam:   IUD strings trimmed to 2-3 cm, normal placement seen       Assessment:    There are no diagnoses linked to this encounter.  normal postpartum exam. ParaGard in place  Plan:   Essential components of care per ACOG recommendations:  1.  Mood and well being: Patient with negative depression screening today. Reviewed local resources for support.  - Patient tobacco use? No.   - hx of drug use? Yes. Discussed support systems and outpatient/inpatient treatment options.    2. Infant care and feeding:  -Patient currently breastmilk feeding? Yes. Discussed returning to work and pumping.  -Social determinants of health (SDOH) reviewed in EPIC. No concerns  3. Sexuality, contraception and birth spacing - Patient does not want a pregnancy in the next year.  Desired family size is 2 children.  - Reviewed reproductive life planning. Reviewed contraceptive methods based on pt preferences and effectiveness.  Patient desired IUD or IUS today.   -  Discussed birth spacing of 18 months  4. Sleep and fatigue -Encouraged family/partner/community support of 4 hrs of uninterrupted sleep to help with mood and fatigue  5. Physical Recovery  - Discussed patients delivery and complications. She describes her labor as good. - Patient had a Vaginal, no problems at delivery. Patient had a 1st degree laceration. Perineal healing reviewed. Patient expressed understanding - Patient has urinary incontinence? Yes. Discussed role of pelvic floor PT. Offered PT and patient declined.  - Patient is safe to resume physical and sexual activity  6.  Health Maintenance - HM due items addressed Yes - Last pap smear  Diagnosis  Date Value Ref  Range Status  06/28/2022   Final   - Negative for intraepithelial lesion or malignancy (NILM)   Pap smear not done at today's visit.  -Breast Cancer screening indicated? No.   7. Chronic Disease/Pregnancy Condition follow up: None  - PCP follow up Adam Phenix, MD [facility]

## 2023-01-12 LAB — GLUCOSE TOLERANCE, 2 HOURS
Glucose, 2 hour: 102 mg/dL (ref 70–139)
Glucose, GTT - Fasting: 83 mg/dL (ref 70–99)
